# Patient Record
Sex: Male | Born: 1990 | Hispanic: No | Marital: Single | Smoking: Former smoker
Health system: Southern US, Community
[De-identification: ages and names within clinical notes are randomized; demographics above are authoritative.]

## PROBLEM LIST (undated history)

## (undated) DIAGNOSIS — A630 Anogenital (venereal) warts: Secondary | ICD-10-CM

## (undated) DIAGNOSIS — K649 Unspecified hemorrhoids: Secondary | ICD-10-CM

## (undated) DIAGNOSIS — R197 Diarrhea, unspecified: Secondary | ICD-10-CM

## (undated) DIAGNOSIS — B20 Human immunodeficiency virus [HIV] disease: Secondary | ICD-10-CM

## (undated) DIAGNOSIS — A539 Syphilis, unspecified: Secondary | ICD-10-CM

## (undated) HISTORY — PX: COLON SURGERY: SHX602

---

## 2009-03-24 DIAGNOSIS — B20 Human immunodeficiency virus [HIV] disease: Secondary | ICD-10-CM

## 2009-03-24 HISTORY — DX: Human immunodeficiency virus (HIV) disease: B20

## 2012-03-24 DIAGNOSIS — A539 Syphilis, unspecified: Secondary | ICD-10-CM

## 2012-03-24 HISTORY — DX: Syphilis, unspecified: A53.9

## 2013-01-15 ENCOUNTER — Emergency Department (HOSPITAL_COMMUNITY): Payer: Medicaid Other

## 2013-01-15 ENCOUNTER — Emergency Department (HOSPITAL_COMMUNITY)
Admission: EM | Admit: 2013-01-15 | Discharge: 2013-01-15 | Disposition: A | Discharge status: Home / Self Care | Payer: Medicaid Other | Attending: Emergency Medicine | Admitting: Emergency Medicine

## 2013-01-15 ENCOUNTER — Encounter (HOSPITAL_COMMUNITY): Payer: Self-pay | Admitting: Emergency Medicine

## 2013-01-15 DIAGNOSIS — Z860100 Personal history of colon polyps, unspecified: Secondary | ICD-10-CM | POA: Insufficient documentation

## 2013-01-15 DIAGNOSIS — K5900 Constipation, unspecified: Secondary | ICD-10-CM | POA: Insufficient documentation

## 2013-01-15 DIAGNOSIS — N390 Urinary tract infection, site not specified: Secondary | ICD-10-CM | POA: Insufficient documentation

## 2013-01-15 DIAGNOSIS — K648 Other hemorrhoids: Secondary | ICD-10-CM | POA: Insufficient documentation

## 2013-01-15 DIAGNOSIS — Z792 Long term (current) use of antibiotics: Secondary | ICD-10-CM | POA: Insufficient documentation

## 2013-01-15 DIAGNOSIS — K649 Unspecified hemorrhoids: Secondary | ICD-10-CM

## 2013-01-15 DIAGNOSIS — K644 Residual hemorrhoidal skin tags: Secondary | ICD-10-CM | POA: Insufficient documentation

## 2013-01-15 DIAGNOSIS — IMO0002 Reserved for concepts with insufficient information to code with codable children: Secondary | ICD-10-CM | POA: Insufficient documentation

## 2013-01-15 DIAGNOSIS — B20 Human immunodeficiency virus [HIV] disease: Secondary | ICD-10-CM

## 2013-01-15 DIAGNOSIS — Z21 Asymptomatic human immunodeficiency virus [HIV] infection status: Secondary | ICD-10-CM | POA: Insufficient documentation

## 2013-01-15 DIAGNOSIS — Z79899 Other long term (current) drug therapy: Secondary | ICD-10-CM | POA: Insufficient documentation

## 2013-01-15 HISTORY — DX: Human immunodeficiency virus (HIV) disease: B20

## 2013-01-15 LAB — COMPREHENSIVE METABOLIC PANEL
ALT: 12 U/L (ref 0–53)
AST: 25 U/L (ref 0–37)
BUN: 9 mg/dL (ref 6–23)
CO2: 25 mEq/L (ref 19–32)
Calcium: 8.9 mg/dL (ref 8.4–10.5)
Chloride: 100 mEq/L (ref 96–112)
Creatinine, Ser: 0.81 mg/dL (ref 0.50–1.35)
GFR calc Af Amer: >90 mL/min (ref >90)
GFR calc non Af Amer: >90 mL/min (ref >90)
Glucose, Bld: 102 mg/dL — ABNORMAL HIGH (ref 70–99)
Sodium: 134 mEq/L — ABNORMAL LOW (ref 135–145)
Total Bilirubin: 0.4 mg/dL (ref 0.3–1.2)
Total Protein: 10.2 g/dL — ABNORMAL HIGH (ref 6.0–8.3)

## 2013-01-15 LAB — URINALYSIS, ROUTINE W REFLEX MICROSCOPIC
Bilirubin Urine: NEGATIVE
Urobilinogen, UA: 0.2 mg/dL (ref 0.0–1.0)
pH: 6 (ref 5.0–8.0)

## 2013-01-15 LAB — CBC WITH DIFFERENTIAL/PLATELET
Basophils Absolute: 0 10*3/uL (ref 0.0–0.1)
Eosinophils Absolute: 0.2 10*3/uL (ref 0.0–0.7)
Eosinophils Relative: 3 % (ref 0–5)
HCT: 37.6 % — ABNORMAL LOW (ref 39.0–52.0)
Lymphocytes Relative: 30 % (ref 12–46)
Lymphs Abs: 2.9 10*3/uL (ref 0.7–4.0)
MCV: 93.3 fL (ref 78.0–100.0)
Monocytes Absolute: 1.1 10*3/uL — ABNORMAL HIGH (ref 0.1–1.0)
RBC: 4.03 MIL/uL — ABNORMAL LOW (ref 4.22–5.81)
RDW: 13.6 % (ref 11.5–15.5)
WBC: 9.7 10*3/uL (ref 4.0–10.5)

## 2013-01-15 LAB — URINE MICROSCOPIC-ADD ON

## 2013-01-15 MED ORDER — DEXTROSE 5 % IV SOLN
1.0000 g | Freq: Once | INTRAVENOUS | Status: AC
  Administered 2013-01-15: 1 g via INTRAVENOUS
  Filled 2013-01-15: qty 10

## 2013-01-15 MED ORDER — HYDROCORTISONE 1 % EX CREA: TOPICAL_CREAM | CUTANEOUS | Status: DC

## 2013-01-15 MED ORDER — CEPHALEXIN 500 MG PO CAPS: 500.0000 mg | ORAL_CAPSULE | Freq: Four times a day (QID) | ORAL | Status: DC

## 2013-01-15 MED ORDER — LORAZEPAM 2 MG/ML IJ SOLN
1.0000 mg | Freq: Once | INTRAMUSCULAR | Status: AC
  Administered 2013-01-15: 1 mg via INTRAVENOUS
  Filled 2013-01-15: qty 1

## 2013-01-15 MED ORDER — SENNOSIDES-DOCUSATE SODIUM 8.6-50 MG PO TABS: 2.0000 | ORAL_TABLET | Freq: Every day | ORAL | Status: DC

## 2013-01-15 MED ORDER — MORPHINE SULFATE 4 MG/ML IJ SOLN
4.0000 mg | Freq: Once | INTRAMUSCULAR | Status: AC
  Administered 2013-01-15: 4 mg via INTRAVENOUS
  Filled 2013-01-15: qty 1

## 2013-01-15 MED ORDER — ONDANSETRON HCL 4 MG/2ML IJ SOLN
4.0000 mg | Freq: Once | INTRAMUSCULAR | Status: AC
  Administered 2013-01-15: 4 mg via INTRAVENOUS
  Filled 2013-01-15: qty 2

## 2013-01-15 MED ORDER — HYDROCODONE-ACETAMINOPHEN 5-325 MG PO TABS: ORAL_TABLET | ORAL | Status: DC

## 2013-01-15 MED ORDER — KETOROLAC TROMETHAMINE 15 MG/ML IJ SOLN
15.0000 mg | Freq: Once | INTRAMUSCULAR | Status: AC
  Administered 2013-01-15: 15 mg via INTRAVENOUS
  Filled 2013-01-15: qty 1

## 2013-01-15 MED ORDER — IOHEXOL 300 MG/ML  SOLN
100.0000 mL | Freq: Once | INTRAMUSCULAR | Status: AC | PRN
  Administered 2013-01-15: 100 mL via INTRAVENOUS

## 2013-01-15 MED ORDER — IOHEXOL 300 MG/ML  SOLN: 50.0000 mL | Freq: Once | INTRAMUSCULAR | Status: DC | PRN

## 2013-01-15 NOTE — ED Provider Notes (Signed)
CSN: [id number]     Arrival date & time 01/15/13  1326 History   First MD Initiated Contact with Patient 01/15/13 1342     Chief Complaint  Patient presents with  . Back Pain   (Consider location/radiation/quality/duration/timing/severity/associated sxs/prior Treatment) HPI  Jay Simon is a 22 y.o. male recently moved to the area, recently diagnosed with HIV (has not started antiretroviral medications yet) he is followed by [facility], has yet to establish care with infectious disease. Since the ED today complaining of worsening right lumbar pain associated with bright red blood per rectal, rectal and abdominal pain with defecation, worsening over the course of the week. Patient reports fever (Tmax last night at 100.4). Patient denies history of IV drug use, cancer, headache, cervicalgia, chest pain, cough, shortness of breath, dysuria, hematuria, melena. Patient has history of colon resection for polyp removal. Family history significant for colon cancer.   Past Medical History  Diagnosis Date  . HIV disease    History reviewed. No pertinent past surgical history. History reviewed. No pertinent family history. History  Substance Use Topics  . Smoking status: Never Smoker   . Smokeless tobacco: Not on file  . Alcohol Use: No    Review of Systems 10 systems reviewed and found to be negative, except as noted in the HPI  Allergies  Risperdal and Shellfish allergy  Home Medications   Current Outpatient Rx  Name  Route  Sig  Dispense  Refill  . acetaminophen (TYLENOL) 500 MG tablet   Oral   Take 500 mg by mouth every 6 (six) hours as needed for pain.         . cephALEXin (KEFLEX) 500 MG capsule   Oral   Take 1 capsule (500 mg total) by mouth 4 (four) times daily.   20 capsule   0   . HYDROcodone-acetaminophen (NORCO/VICODIN) 5-325 MG per tablet      Take 1-2 tablets by mouth every 6 hours as needed for pain.   15 tablet   0   .  hydrocortisone cream 1 %      Apply to affected area 2 times daily   30 g   0   . senna-docusate (SENOKOT-S) 8.6-50 MG per tablet   Oral   Take 2 tablets by mouth daily.   30 tablet   0    BP 108/66  Pulse 91  Temp(Src) 98.1 F (36.7 C) (Oral)  Resp 26  Ht 5\' 8"  (1.727 m)  Wt 142 lb 5 oz (64.553 kg)  BMI 21.64 kg/m2  SpO2 99% Physical Exam  Nursing note and vitals reviewed. Constitutional: He is oriented to person, place, and time. He appears well-developed and well-nourished. No distress.  HENT:  Head: Normocephalic and atraumatic.  Mouth/Throat: Oropharynx is clear and moist.  Eyes: Conjunctivae and EOM are normal. Pupils are equal, round, and reactive to light.  Neck: Normal range of motion. Neck supple.  Cardiovascular: Normal rate, regular rhythm and intact distal pulses.   Pulmonary/Chest: Effort normal and breath sounds normal. No stridor. No respiratory distress. He has no wheezes. He has no rales. He exhibits no tenderness.  Abdominal: Soft. Bowel sounds are normal. He exhibits no distension and no mass. There is no tenderness. There is no rebound and no guarding.  Genitourinary:  No CVA TTP Bilaterally  GU exam chaperoned by PA-S Sherrye Payor:  No rashes or lesions, +external and internal hemorrhoids, normal rectal tone. Normal stool color. No gross blood.  Musculoskeletal: Normal  range of motion. He exhibits no edema.  No point tenderness to percussion of lumbar spinal processes.  No TTP or paraspinal spasm. Strength is 5 out of 5 to bilateral lower extremities at hip and knee;extensor hallucis longus 5 out of 5. Ankle strength 5 out of 5, no clonus, neurovascularly intact.  No saddle anaesthesia.      Neurological: He is alert and oriented to person, place, and time.  Skin:  No injection marks to bilateral AC areas  Psychiatric: He has a normal mood and affect.    ED Course  Procedures (including critical care time) Labs Review Labs Reviewed   URINALYSIS, ROUTINE W REFLEX MICROSCOPIC - Abnormal; Notable for the following:    APPearance TURBID (*)    Hgb urine dipstick MODERATE (*)    Protein, ur 30 (*)    Nitrite POSITIVE (*)    Leukocytes, UA LARGE (*)    All other components within normal limits  CBC WITH DIFFERENTIAL - Abnormal; Notable for the following:    RBC 4.03 (*)    Hemoglobin 12.5 (*)    HCT 37.6 (*)    Monocytes Absolute 1.1 (*)    All other components within normal limits  COMPREHENSIVE METABOLIC PANEL - Abnormal; Notable for the following:    Sodium 134 (*)    Glucose, Bld 102 (*)    Total Protein 10.2 (*)    Albumin 3.1 (*)    All other components within normal limits  URINE MICROSCOPIC-ADD ON - Abnormal; Notable for the following:    Bacteria, UA FEW (*)    All other components within normal limits  URINE CULTURE  LIPASE, BLOOD  CD4/CD8 (T-HELPER/T-SUPPRESSOR CELL)   Imaging Review Ct Abdomen Pelvis W Contrast  01/15/2013   CLINICAL DATA:  Lower back pain, abdominal pain, rectal bleeding  EXAM: CT ABDOMEN AND PELVIS WITH CONTRAST  TECHNIQUE: Multidetector CT imaging of the abdomen and pelvis was performed using the standard protocol following bolus administration of intravenous contrast.  CONTRAST:  OMNIPAQUE IOHEXOL 300 MG/ML  SOLN  COMPARISON:  None.  FINDINGS: Lung bases are unremarkable. Sagittal and coronal images of the lumbar spine are unremarkable. No destructive bony lesions are noted. Additional reconstructed axial images of the lumbar spine are unremarkable.  Liver, spleen, pancreas and adrenals are unremarkable. No calcified gallstones are noted within gallbladder. Abdominal aorta is unremarkable. Kidneys are symmetrical in size and enhancement. No hydronephrosis or hydroureter.  Abundant stool are noted in right colon and transverse colon. No pericecal inflammation. The appendix is not clearly identified. The terminal ileum is unremarkable. No small bowel obstruction. No colonic  obstruction. No ascites or free air. No adenopathy.  Nonspecific minimal thickening and enhancement of urinary bladder wall. Urinary bladder inflammation or mild cystitis cannot be excluded. Clinical correlation is necessary. No distal colonic obstruction. Prostate gland and seminal vesicles are unremarkable. Bilateral small nonspecific inguinal lymph nodes are noted.  IMPRESSION: 1. No hydronephrosis or hydroureter. 2. Abundant colonic stool in right colon and transverse colon. No pericecal inflammation. The appendix is not clearly identified. 3. Nonspecific minimal thickening of the wall and enhancement of the wall of urinary bladder. Mild inflammation or cystitis cannot be excluded. Clinical correlation is necessary. 4. No destructive bony lesions are noted. No acute fractures.   Electronically Signed   By: Natasha Mead M.D.   On: 01/15/2013 18:18    EKG Interpretation   None       MDM   1. UTI (lower urinary tract infection)  2. Hemorrhoids   3. Constipated   4. HIV (human immunodeficiency virus infection)      Filed Vitals:   01/15/13 1830 01/15/13 1900 01/15/13 1930 01/15/13 2002  BP: 105/67 100/59 108/66 110/75  Pulse: 100 97 91 93  Temp:    99.5 F (37.5 C)  TempSrc:    Oral  Resp:    18  Height:      Weight:      SpO2: 100% 99% 99% 99%     Jay Simon is a 22 y.o. male with newly diagnosed HIV complaining of blood streaked stool, right lumbar pain and fever of 100.4. Urinalysis consistent with infection, patient is given a gram of Rocephin IV. He has no CVA tenderness. I doubt that this is a pyelonephritis. Blood work is otherwise unremarkable. I have ordered a CD4 count 4 expedited care at [facility]. CT abdomen pelvis shows a large stool burden and inflammation of the bladder. There is a delay in reading the lumbar spine CT.   The case signed out to PA  Dammen at shift change. Plan is to followup lumbar CT and d/c if negative.   Medications   morphine 4 MG/ML injection 4 mg (4 mg Intravenous Given 01/15/13 1440)  ondansetron (ZOFRAN) injection 4 mg (4 mg Intravenous Given 01/15/13 1440)  LORazepam (ATIVAN) injection 1 mg (1 mg Intravenous Given 01/15/13 1517)  ketorolac (TORADOL) 15 MG/ML injection 15 mg (15 mg Intravenous Given 01/15/13 1517)  iohexol (OMNIPAQUE) 300 MG/ML solution 100 mL (100 mLs Intravenous Contrast Given 01/15/13 1737)  cefTRIAXone (ROCEPHIN) 1 g in dextrose 5 % 50 mL IVPB (0 g Intravenous Stopped 01/15/13 1954)    Pt is hemodynamically stable, appropriate for, and amenable to discharge at this time. Pt verbalized understanding and agrees with care plan. All questions answered. Outpatient follow-up and specific return precautions discussed.    New Prescriptions   CEPHALEXIN (KEFLEX) 500 MG CAPSULE    Take 1 capsule (500 mg total) by mouth 4 (four) times daily.   HYDROCODONE-ACETAMINOPHEN (NORCO/VICODIN) 5-325 MG PER TABLET    Take 1-2 tablets by mouth every 6 hours as needed for pain.   HYDROCORTISONE CREAM 1 %    Apply to affected area 2 times daily   SENNA-DOCUSATE (SENOKOT-S) 8.6-50 MG PER TABLET    Take 2 tablets by mouth daily.    Note: Portions of this report may have been transcribed using voice recognition software. Every effort was made to ensure accuracy; however, inadvertent computerized transcription errors may be present      Wynetta Emery, PA-C 01/16/13 [phone]

## 2013-01-15 NOTE — ED Provider Notes (Signed)
Medical screening examination/treatment/procedure(s) were performed by non-physician practitioner and as supervising physician I was immediately available for consultation/collaboration.      Charles B. Bernette Mayers, MD 01/15/13 2137

## 2013-01-15 NOTE — ED Notes (Addendum)
Pt reports over past 2-3 days he's been having severe lower back pain and noticed some blood in his stool. States the pain is making him nauseated. States he is very anxious about the pain and now he feels like hes having a panic attack. States he was recently diagnosed with HIV

## 2013-01-15 NOTE — ED Provider Notes (Signed)
Jay Simon S 8:00 PM patient discussed in signout. Patient recently diagnosed with HIV. Has signs of UTI also with some mild backache and abdominal discomforts. CT scans pending.  CT scans show large stool burden and signs of cystitis. No signs of pyelonephritis. No kidney stones. No concerning findings along the lumbar spine. At this time patient stable to be discharged and treated for UTI and constipation. He will followup with his doctors and ID doctors.    Angus Seller, PA-C 01/15/13 2023

## 2013-01-16 NOTE — ED Provider Notes (Signed)
Medical screening examination/treatment/procedure(s) were performed by non-physician practitioner and as supervising physician I was immediately available for consultation/collaboration.    Devoria Albe, MD, Armando Gang   Ward Givens, MD 01/16/13 1101

## 2013-01-17 LAB — URINE CULTURE: Colony Count: >=100000

## 2013-01-17 LAB — CD4/CD8 (T-HELPER/T-SUPPRESSOR CELL)
CD4 absolute: 310 /uL — ABNORMAL LOW (ref 500–1900)
CD4%: 13 % — ABNORMAL LOW (ref 30.0–60.0)

## 2013-01-18 ENCOUNTER — Telehealth (HOSPITAL_COMMUNITY): Payer: Self-pay | Admitting: Emergency Medicine

## 2013-01-18 NOTE — ED Notes (Signed)
Post ED Visit - Positive Culture Follow-up  Culture report reviewed by antimicrobial stewardship pharmacist: []  Wes Dulaney, Pharm.D., BCPS []  Celedonio Miyamoto, Pharm.D., BCPS []  Georgina Pillion, Pharm.D., BCPS [x]  [place], [address].D., BCPS, AAHIVP []  Estella Husk, Pharm.D., BCPS, AAHIVP  Positive urine culture Treated with Keflex, organism sensitive to the same and no further patient follow-up is required at this time.  Kylie A Holland 01/18/2013, 11:23 AM

## 2013-02-09 ENCOUNTER — Telehealth: Payer: Self-pay

## 2013-02-09 NOTE — Telephone Encounter (Signed)
Patient contacted regarding new intake appointment. Date and time given. Information given regarding documents needed to qualify for financial eligibility.  Tammy K King, RN  

## 2013-02-20 ENCOUNTER — Encounter (HOSPITAL_COMMUNITY): Payer: Self-pay | Admitting: Emergency Medicine

## 2013-02-20 ENCOUNTER — Inpatient Hospital Stay (HOSPITAL_COMMUNITY)
Admission: EM | Admit: 2013-02-20 | Discharge: 2013-02-25 | DRG: 974 | Disposition: A | Discharge status: Home Health | Payer: Medicaid Other | Attending: Family Medicine | Admitting: Family Medicine

## 2013-02-20 ENCOUNTER — Emergency Department (HOSPITAL_COMMUNITY): Payer: Medicaid Other

## 2013-02-20 DIAGNOSIS — K5900 Constipation, unspecified: Secondary | ICD-10-CM | POA: Diagnosis present

## 2013-02-20 DIAGNOSIS — B20 Human immunodeficiency virus [HIV] disease: Secondary | ICD-10-CM | POA: Diagnosis present

## 2013-02-20 DIAGNOSIS — Z23 Encounter for immunization: Secondary | ICD-10-CM

## 2013-02-20 DIAGNOSIS — D649 Anemia, unspecified: Secondary | ICD-10-CM | POA: Diagnosis present

## 2013-02-20 DIAGNOSIS — R195 Other fecal abnormalities: Secondary | ICD-10-CM

## 2013-02-20 DIAGNOSIS — R7881 Bacteremia: Secondary | ICD-10-CM

## 2013-02-20 DIAGNOSIS — Z21 Asymptomatic human immunodeficiency virus [HIV] infection status: Secondary | ICD-10-CM

## 2013-02-20 DIAGNOSIS — Z87891 Personal history of nicotine dependence: Secondary | ICD-10-CM

## 2013-02-20 DIAGNOSIS — A419 Sepsis, unspecified organism: Secondary | ICD-10-CM | POA: Diagnosis present

## 2013-02-20 DIAGNOSIS — N151 Renal and perinephric abscess: Secondary | ICD-10-CM | POA: Diagnosis present

## 2013-02-20 DIAGNOSIS — Z860100 Personal history of colon polyps, unspecified: Secondary | ICD-10-CM

## 2013-02-20 DIAGNOSIS — A630 Anogenital (venereal) warts: Secondary | ICD-10-CM | POA: Diagnosis present

## 2013-02-20 DIAGNOSIS — N390 Urinary tract infection, site not specified: Secondary | ICD-10-CM

## 2013-02-20 DIAGNOSIS — N10 Acute tubulo-interstitial nephritis: Secondary | ICD-10-CM | POA: Diagnosis present

## 2013-02-20 DIAGNOSIS — I959 Hypotension, unspecified: Secondary | ICD-10-CM

## 2013-02-20 DIAGNOSIS — N12 Tubulo-interstitial nephritis, not specified as acute or chronic: Secondary | ICD-10-CM | POA: Diagnosis present

## 2013-02-20 DIAGNOSIS — A4151 Sepsis due to Escherichia coli [E. coli]: Principal | ICD-10-CM | POA: Diagnosis present

## 2013-02-20 DIAGNOSIS — B977 Papillomavirus as the cause of diseases classified elsewhere: Secondary | ICD-10-CM | POA: Diagnosis present

## 2013-02-20 DIAGNOSIS — Z85038 Personal history of other malignant neoplasm of large intestine: Secondary | ICD-10-CM

## 2013-02-20 DIAGNOSIS — Z87898 Personal history of other specified conditions: Secondary | ICD-10-CM

## 2013-02-20 DIAGNOSIS — E43 Unspecified severe protein-calorie malnutrition: Secondary | ICD-10-CM | POA: Diagnosis present

## 2013-02-20 DIAGNOSIS — E871 Hypo-osmolality and hyponatremia: Secondary | ICD-10-CM | POA: Diagnosis present

## 2013-02-20 DIAGNOSIS — B1710 Acute hepatitis C without hepatic coma: Secondary | ICD-10-CM | POA: Diagnosis present

## 2013-02-20 HISTORY — DX: Syphilis, unspecified: A53.9

## 2013-02-20 HISTORY — DX: Anogenital (venereal) warts: A63.0

## 2013-02-20 LAB — BASIC METABOLIC PANEL
BUN: 10 mg/dL (ref 6–23)
BUN: 9 mg/dL (ref 6–23)
Calcium: 7.2 mg/dL — ABNORMAL LOW (ref 8.4–10.5)
Calcium: 7.2 mg/dL — ABNORMAL LOW (ref 8.4–10.5)
Chloride: 98 mEq/L (ref 96–112)
Creatinine, Ser: 0.89 mg/dL (ref 0.50–1.35)
GFR calc Af Amer: >90 mL/min (ref >90)
GFR calc Af Amer: >90 mL/min (ref >90)
GFR calc non Af Amer: >90 mL/min (ref >90)
GFR calc non Af Amer: >90 mL/min (ref >90)
Glucose, Bld: 107 mg/dL — ABNORMAL HIGH (ref 70–99)
Potassium: 3.3 mEq/L — ABNORMAL LOW (ref 3.5–5.1)
Sodium: 129 mEq/L — ABNORMAL LOW (ref 135–145)
Sodium: 127 mEq/L — ABNORMAL LOW (ref 135–145)

## 2013-02-20 LAB — CBC WITH DIFFERENTIAL/PLATELET
Basophils Absolute: 0 10*3/uL (ref 0.0–0.1)
Basophils Relative: 0 % (ref 0–1)
Eosinophils Relative: 0 % (ref 0–5)
HCT: 30.3 % — ABNORMAL LOW (ref 39.0–52.0)
MCHC: 34.3 g/dL (ref 30.0–36.0)
MCV: 89.1 fL (ref 78.0–100.0)
Monocytes Absolute: 1.5 10*3/uL — ABNORMAL HIGH (ref 0.1–1.0)
Neutrophils Relative %: 67 % (ref 43–77)
RDW: 15.6 % — ABNORMAL HIGH (ref 11.5–15.5)
WBC: 9.5 10*3/uL (ref 4.0–10.5)

## 2013-02-20 LAB — RAPID URINE DRUG SCREEN, HOSP PERFORMED
Amphetamines: NOT DETECTED
Opiates: NOT DETECTED

## 2013-02-20 LAB — LIPASE, BLOOD: Lipase: 12 U/L (ref 11–59)

## 2013-02-20 LAB — URINALYSIS, ROUTINE W REFLEX MICROSCOPIC
Bilirubin Urine: NEGATIVE
Glucose, UA: NEGATIVE mg/dL
Ketones, ur: NEGATIVE mg/dL
pH: 5.5 (ref 5.0–8.0)

## 2013-02-20 LAB — COMPREHENSIVE METABOLIC PANEL
AST: 24 U/L (ref 0–37)
Albumin: 2.4 g/dL — ABNORMAL LOW (ref 3.5–5.2)
Calcium: 8.3 mg/dL — ABNORMAL LOW (ref 8.4–10.5)
Creatinine, Ser: 1.16 mg/dL (ref 0.50–1.35)
GFR calc Af Amer: >90 mL/min (ref >90)
GFR calc non Af Amer: 87 mL/min — ABNORMAL LOW (ref >90)
Glucose, Bld: 101 mg/dL — ABNORMAL HIGH (ref 70–99)
Total Bilirubin: 0.4 mg/dL (ref 0.3–1.2)

## 2013-02-20 LAB — URINE MICROSCOPIC-ADD ON

## 2013-02-20 LAB — LACTIC ACID, PLASMA: Lactic Acid, Venous: 1.4 mmol/L (ref 0.5–2.2)

## 2013-02-20 LAB — OCCULT BLOOD, POC DEVICE: Fecal Occult Bld: POSITIVE — AB

## 2013-02-20 MED ORDER — POLYETHYLENE GLYCOL 3350 17 G PO PACK
17.0000 g | PACK | Freq: Every day | ORAL | Status: DC | PRN
  Filled 2013-02-20: qty 1

## 2013-02-20 MED ORDER — DEXTROSE 5 % IV SOLN
1.0000 g | Freq: Four times a day (QID) | INTRAVENOUS | Status: DC
  Administered 2013-02-20: 1 g via INTRAVENOUS
  Filled 2013-02-20 (×3): qty 1

## 2013-02-20 MED ORDER — SODIUM CHLORIDE 0.9 % IV BOLUS (SEPSIS)
1000.0000 mL | Freq: Once | INTRAVENOUS | Status: AC
  Administered 2013-02-20: 1000 mL via INTRAVENOUS

## 2013-02-20 MED ORDER — PIPERACILLIN-TAZOBACTAM 3.375 G IVPB
3.3750 g | Freq: Three times a day (TID) | INTRAVENOUS | Status: DC
  Administered 2013-02-21 (×3): 3.375 g via INTRAVENOUS
  Filled 2013-02-20 (×9): qty 50

## 2013-02-20 MED ORDER — SODIUM CHLORIDE 0.9 % IV SOLN
INTRAVENOUS | Status: DC
  Administered 2013-02-20: 18:00:00 via INTRAVENOUS

## 2013-02-20 MED ORDER — IBUPROFEN 800 MG PO TABS
800.0000 mg | ORAL_TABLET | Freq: Three times a day (TID) | ORAL | Status: DC | PRN
  Administered 2013-02-20 – 2013-02-23 (×2): 800 mg via ORAL
  Filled 2013-02-20 (×4): qty 1

## 2013-02-20 MED ORDER — METRONIDAZOLE IN NACL 5-0.79 MG/ML-% IV SOLN
500.0000 mg | Freq: Three times a day (TID) | INTRAVENOUS | Status: DC
  Administered 2013-02-21 (×3): 500 mg via INTRAVENOUS
  Filled 2013-02-20 (×5): qty 100

## 2013-02-20 MED ORDER — SODIUM CHLORIDE 0.9 % IJ SOLN
3.0000 mL | Freq: Two times a day (BID) | INTRAMUSCULAR | Status: DC
  Administered 2013-02-20 – 2013-02-25 (×7): 3 mL via INTRAVENOUS

## 2013-02-20 MED ORDER — POTASSIUM CHLORIDE IN NACL 40-0.9 MEQ/L-% IV SOLN
INTRAVENOUS | Status: DC
  Administered 2013-02-20 – 2013-02-21 (×3): via INTRAVENOUS
  Filled 2013-02-20 (×9): qty 1000

## 2013-02-20 MED ORDER — VANCOMYCIN HCL IN DEXTROSE 1-5 GM/200ML-% IV SOLN
1000.0000 mg | Freq: Three times a day (TID) | INTRAVENOUS | Status: DC
  Administered 2013-02-21 (×3): 1000 mg via INTRAVENOUS
  Filled 2013-02-20 (×5): qty 200

## 2013-02-20 MED ORDER — HEPARIN SODIUM (PORCINE) 5000 UNIT/ML IJ SOLN
5000.0000 [IU] | Freq: Three times a day (TID) | INTRAMUSCULAR | Status: DC
  Administered 2013-02-20 – 2013-02-22 (×3): 5000 [IU] via SUBCUTANEOUS
  Filled 2013-02-20 (×18): qty 1

## 2013-02-20 MED ORDER — HYDROCORTISONE SOD SUCCINATE 100 MG IJ SOLR
50.0000 mg | Freq: Four times a day (QID) | INTRAMUSCULAR | Status: DC
  Administered 2013-02-21 (×3): 50 mg via INTRAVENOUS
  Filled 2013-02-20 (×8): qty 1

## 2013-02-20 MED ORDER — DEXTROSE 5 % IV SOLN
2.0000 g | Freq: Once | INTRAVENOUS | Status: AC
  Administered 2013-02-20: 2 g via INTRAVENOUS
  Filled 2013-02-20: qty 2

## 2013-02-20 MED ORDER — TRAMADOL HCL 50 MG PO TABS
50.0000 mg | ORAL_TABLET | Freq: Four times a day (QID) | ORAL | Status: DC | PRN
  Administered 2013-02-20: 50 mg via ORAL
  Filled 2013-02-20: qty 1

## 2013-02-20 MED ORDER — ACETAMINOPHEN 325 MG PO TABS
650.0000 mg | ORAL_TABLET | Freq: Once | ORAL | Status: AC
  Administered 2013-02-20: 650 mg via ORAL
  Filled 2013-02-20: qty 2

## 2013-02-20 MED ORDER — IBUPROFEN 800 MG PO TABS: 800.0000 mg | ORAL_TABLET | Freq: Three times a day (TID) | ORAL | Status: DC

## 2013-02-20 NOTE — H&P (Signed)
Family Medicine Teaching [facility] Admission History and Physical Service Pager: [phone]  Patient name: Jay Simon Medical record number: [id number] Date of birth: 1990-09-09 Age: 22 y.o. Gender: male  Primary Care Provider: No PCP Per Patient Consultants: Infectious diseases Code Status: Full code  Chief Complaint: Abdominal pain, dysuria  Assessment and Plan: Jay Simon is a 22 y.o. male presenting with UTI . PMH is significant for HIV, ?Colon cancer  # Sepsis secondary to Pyelonephritis: patient has abdominal pain similar to previous presentation. Urinalysis suggestive of UTI with patient having episode of fever up to 102.6 while in ED. Patient also very tachycardic, tachypneic and mildly hypotensive to systolic 90s. He meets SIRS criteria with a source of infection  Admit to telemetry, attending physician Dr. Kandis Mannan antibiotics to include pseudomonas coverage: Cefotaxime 1g q6 hours  Monitor vitals closely  # HIV infection: diagnosed in summer 2014. Last CD4 count was 310 on 01/15/2013. Has a history of syphilis that was treated  Infectious diseases consulted. follow-up recommendations (to see patient tomorrow)  # Hyponatremia: Current sodium is 124. Unknown cause. Patient appears euvolemic. Patient has no mental status changes. May be erroneous result vs SIADH.  Replete slowly with normal saline @50ml /hr  Recheck Bmet now as result may be erroneous  Recheck Bmet q12 hours unless comes back normal  # Hx of ?colon cancer: per patient, had multiple polyps removed in 2012 and was told they were cancerous. Was supposed to follow-up last year but did not.  Refer to GI outpatient for follow-up colonoscopy  Consider obtaining records  # Normocytic anemia: has history of colon cancer and recent blood per rectum found on stool. FOBT was positive. Hemoglobin currently 10.4, down from 12.5  Repeat CBC and trend hemoglobin   FEN/GI: advance diet as  tolerate Prophylaxis: Heparin  Disposition: Admit to telemetry.   History of Present Illness: Jay Simon is a 22 y.o. male presenting with abdominal pain and dysuria. About a month ago, Jay Simon had similar symptoms and presented to the ED where he received Keflex to treat a UTI. After treatment, his symptoms subsided until a few weeks ago where he started having the same back/abdominal pain and dysuria. These symptoms progressively increased until today when he came in to the ED. He has taken Tylenol and Bayer Back and Body for the pain, which has helped. Urinary symptoms include dysuria, frequency and urgency. He has an associated fever to 102 and chills. He has had some nausea with non-bloody, non-bilious vomiting twice yesterday. He has only been able to tolerate water and ice by mouth. He has been constipated since last week and noticed some blood in his stool yesterday after having a bowel movement. He has a dry cough with no sputum production, no chest pain, no shortness of breath. He has a history of polyps removal in 2012 at [facility] in 2012 which showed cancerous cells. He was supposed to follow-up last year, but did not. He was diagnosed with HIV this summer but has not been treated. He has a history of syphilis which was treated with penicillin. He has an appointment with infectious diseases in December to see Jay Simon.  In the ED, he was given a total of 3L of NS boluses for low blood pressures and one dose of ceftriaxone.   Review Of Systems: Per HPI with the following additions:  Otherwise 12 point review of systems was performed and was unremarkable.  There are no active problems to display for  this patient.  Past Medical History: Past Medical History  Diagnosis Date  . HIV disease    Past Surgical History: Past Surgical History  Procedure Laterality Date  . Colon surgery     Social History: History  Substance Use Topics  . Smoking status: Former  Smoker    Types: Cigarettes  . Smokeless tobacco: Not on file  . Alcohol Use: Yes     Comment: occasionally   Additional social history:   Please also refer to relevant sections of EMR.  Family History: No family history on file.  Cancer - aunt, Mgrandmother (breast). Cousin skin cancer. DM - mom, MGM, MGF,  Allergies and Medications: Allergies  Allergen Reactions  . Risperdal [Risperidone] Anaphylaxis  . Shellfish Allergy Anaphylaxis   No current facility-administered medications on file prior to encounter.   No current outpatient prescriptions on file prior to encounter.    Objective: BP 108/70  Pulse 113  Temp(Src) 99.7 F (37.6 C) (Oral)  Resp 26  Wt 142 lb (64.411 kg)  SpO2 100%  Exam: General: Laying in bed in no acute distress HEENT: Oral cavity dry Cardiovascular: Tachycardic, normal rhythm, no murmur Respiratory: clear to auscultation bilaterally, no wheeze Abdomen: Soft, mild tenderness in lower quadrants, no rebound, no guarding Extremities: No edema Skin: No cyanosis, no rashes noted Neuro: Alert and oriented x3  Labs and Imaging: CBC BMET   Recent Labs Lab 02/20/13 0940  WBC 9.5  HGB 10.4*  HCT 30.3*  PLT 177    Recent Labs Lab 02/20/13 0940  NA 124*  K 3.4*  CL 89*  CO2 24  BUN 13  CREATININE 1.16  GLUCOSE 101*  CALCIUM 8.3*     Lymphocyte Subsets  01/15/2013 14:28  CD4 absolute 310 (L)  Total lymphocyte count 2490  CD4% 13 (L)  CD8tox 64 (H)  CD8 T Cell Abs 1590 (H)  Ratio 0.20 (L)   Dg Chest 2 View  02/20/2013   CLINICAL DATA:  Chest pain with fever, nausea/vomiting  EXAM: CHEST  2 VIEW  COMPARISON:  None.  FINDINGS: Lungs are clear. No pleural effusion or pneumothorax.  The heart is normal in size.  Visualized osseous structures are within normal limits.  IMPRESSION: Normal chest radiographs.   Electronically Signed   By: Charline Bills M.D.   On: 02/20/2013 10:26    Jacquelin Hawking, MD 02/20/2013, 1:22 PM PGY-1, [facility] FPTS Intern pager: [phone], text pages welcome  R2 Addendum:  I have seen the above patient and have discussed the patient's presentation, history, objective data, physical exam and assessment and plan with Dr. Mal Misty.  Briefly, this patient is a 22 y.o. year old male presents for abdominal pain and dysuria.  Found to have a urinary tract infection and has been started on and has been transitioned Cefotaxime given his HIV status.  ID will see in the morning and have ordered appropriate labs.  Patient does have hyponatremia and tachycardia.  He is status post 3 L of normal saline in the emergency department.  Persistent hyponatremia, repleating fluids with additional of NS.  Lipase is normal.  Given persistent tachycardia concerns for worsening sepsis picture so plan to repeat lactic acid if tachycardia is not improved when afebrile or if any evidence of blood pressure instability.  Most recent CD4 count greater than 300, no indication for PCP or MAC prophylaxis.  Cultures pending, continue to follow closely.  Further workup of anemia and heme-positive stool as outpatient, unless clinical  worsening.    Andrena Mews, DO [facility] Resident - PGY-3 02/20/2013 8:37 PM

## 2013-02-20 NOTE — Progress Notes (Signed)
ANTIBIOTIC CONSULT NOTE - INITIAL  Pharmacy Consult for Vanco/Zosyn Indication: UTI, sepsis, fever  Allergies  Allergen Reactions  . Risperdal [Risperidone] Anaphylaxis  . Shellfish Allergy Anaphylaxis    Patient Measurements: Height: 5\' 9"  (175.3 cm) Weight: 134 lb (60.782 kg) IBW/kg (Calculated) : 70.7 Adjusted Body Weight:   Vital Signs: Temp: 104 F (40 C) (11/30 2139) Temp src: Oral (11/30 2139) BP: 91/51 mmHg (11/30 2133) Pulse Rate: 134 (11/30 2133) Intake/Output from previous day:   Intake/Output from this shift:    Labs:  Recent Labs  02/20/13 0940 02/20/13 1626 02/20/13 1905  WBC 9.5  --   --   HGB 10.4*  --   --   PLT 177  --   --   CREATININE 1.16 0.89 0.86   Estimated Creatinine Clearance: 113.9 ml/min (by C-G formula based on Cr of 0.86). No results found for this basename: VANCOTROUGH, VANCOPEAK, VANCORANDOM, GENTTROUGH, GENTPEAK, GENTRANDOM, TOBRATROUGH, TOBRAPEAK, TOBRARND, AMIKACINPEAK, AMIKACINTROU, AMIKACIN,  in the last 72 hours   Microbiology: No results found for this or any previous visit (from the past 720 hour(s)).  Medical History: Past Medical History  Diagnosis Date  . HIV disease     Medications:  Prescriptions prior to admission  Medication Sig Dispense Refill  . OVER THE COUNTER MEDICATION Take 2 tablets by mouth daily as needed (pain). Bayer Back & Body       Assessment:  22 y/o M with h/o newly dx HIV presents with fever, chills, sweats, flank pain, N/V, dysuria. UA suggests UTI. Symptoms in ER include temp up to 102.6, tachycardia, tachypnea, and hypotension. FOB +.  Temp 104. WBC 9.5. Na 127, Scr 0.86 (CrCl >100)  Goal of Therapy:  Vancomycin trough level 15-20 mcg/ml  Plan:  D/c Rocephin, Cefotaxime Zosyn 3.375g Iv q8hr Vancomycin 1g IV q8hr. Trough after 3-5 doses.   Jay Simon S. Merilynn Finland, PharmD, BCPS Clinical Staff Pharmacist Pager [phone]  Misty Stanley Stillinger 02/20/2013,10:11 PM

## 2013-02-20 NOTE — ED Notes (Signed)
Pt arrived with significant other with c/o fevers, chills and sweats. Flank pain that radiates around right side to abdomen and weakness since 02/15/13. Pt stated that he has be nauseated and vomited x2 in the past 24hrs. Stated that he was constipated and took laxatives with relief. Tried Theraflu and Bayer back and body at home with some relief of symptoms but symptoms return.

## 2013-02-20 NOTE — Progress Notes (Signed)
S: feeling very anxious, tolerating small sips of liquid, attesting to continued abdominal discomfort O: BP 91/51  Pulse 134  Temp(Src) 104 F (40 C) (Oral)  Resp 18  Ht 5\' 9"  (1.753 m)  Wt 134 lb (60.782 kg)  BMI 19.78 kg/m2  SpO2 99% Gen: NAD, lying in bed, asking questions, diaphoretic  Abdomen: positive murphy's (questionable palpable gallbladder) and suprapubic tenderness, voluntary guarding, no rebound A/P: 22 y.o M with hx of untreated HIV presenting with n/v found to have pyelo now meeting septic criteria, previously on cefotax, possible superimposed cholecystitis vs enterohemorrhagic gastritis vs perf  -transition to vanc/zosyn for broader coverage given fever curve -adding flagyl for anerobic coverage given abdominal exam -stress dose steroids following cortisol levels -CT ab/pelvis -ibuprofen for fever prn -CCM aware, low threshold for transfer -close vs monitoring  Charlane Ferretti, MD Family Medicine PGY-1 Please page or call with questions

## 2013-02-20 NOTE — ED Provider Notes (Signed)
CSN: [id number]     Arrival date & time 02/20/13  0901 History   First MD Initiated Contact with Patient 02/20/13 [phone]     Chief Complaint  Patient presents with  . Fever  . Night Sweats  . Flank Pain    HPI  Jay Simon is a 22 y.o. male with a PMH of HIV who presents to the ED for evaluation of fever, night sweats, and flank pain.  History was provided by the patient.  Patient states that he has not been feeling well for the past 6 days. He states that he has had constant cramping/aching right lower quadrant, flank, and lower back pain the past 6 days. Movement makes his pain worse.  She had similar pain about a month ago and was seen in the emergency department on 01/15/13. He was diagnosed with a urinary tract infection and constipation. He was discharged with Keflex antibiotics which he took the full prescription. He states that he felt better while taking antibiotics but his symptoms returned after he finished the antibiotics. He states he was taking a stool softener that was prescribed which helped his symptoms as well. He has been taking Aleve for his abdominal pain with temporary relief in his pain.  He also states that he has had some dysuria with no hematuria, penile pain/discharge, or testicular pain.  He states that he started vomiting yesterday. He has had multiple episodes of emesis and is unable to keep anything down. No hematemesis. He states that he was constipated a few days ago he took a laxative and is now having regular bowel movements again. No rectal pain or rectal bleeding. Patient denies any trauma or injuries. He also has had a non-productive cough for the past few days.  No chest pain or SOB.  He also has had fevers of 102 a few days ago.  Patient was diagnosed with HIV this summer. He has not yet started antiviral medications and has a scheduled appointment with an infectious disease specialist coming up. She denies any previous abdominal surgeries.  Colon surgery in the  past was for polyp removal and was done rectally.  He denies any history of kidney stones. No sick contacts. No recent travel.   Past Medical History  Diagnosis Date  . HIV disease    Past Surgical History  Procedure Laterality Date  . Colon surgery     No family history on file. History  Substance Use Topics  . Smoking status: Former Smoker    Types: Cigarettes  . Smokeless tobacco: Not on file  . Alcohol Use: Yes     Comment: occasionally    Review of Systems  Constitutional: Positive for fever, chills, diaphoresis, activity change, appetite change and fatigue.  HENT: Negative for congestion, rhinorrhea and sore throat.   Eyes: Negative for photophobia and visual disturbance.  Respiratory: Positive for cough. Negative for choking, shortness of breath and wheezing.   Cardiovascular: Negative for chest pain, palpitations and leg swelling.  Gastrointestinal: Positive for nausea, vomiting, abdominal pain and constipation. Negative for diarrhea, blood in stool, abdominal distention, anal bleeding and rectal pain.  Genitourinary: Positive for dysuria and flank pain. Negative for hematuria, decreased urine volume, penile swelling, scrotal swelling, difficulty urinating, genital sores, penile pain and testicular pain.  Musculoskeletal: Positive for back pain. Negative for gait problem, joint swelling, myalgias and neck pain.  Skin: Negative for color change.  Neurological: Positive for weakness (generalized). Negative for dizziness, light-headedness, numbness and headaches.  Psychiatric/Behavioral: Negative for  confusion.    Allergies  Risperdal and Shellfish allergy  Home Medications   Current Outpatient Rx  Name  Route  Sig  Dispense  Refill  . OVER THE COUNTER MEDICATION   Oral   Take 2 tablets by mouth daily as needed (pain). [facility]          BP 109/64  Pulse 130  Temp(Src) 101 F (38.3 C) (Oral)  Resp 18  Wt 142 lb (64.411 kg)  SpO2 100%  Filed  Vitals:   02/20/13 1030 02/20/13 1045 02/20/13 1100 02/20/13 1142  BP: 102/61 103/62 102/67 94/50  Pulse: 121 123 126 113  Temp:    99.7 F (37.6 C)  TempSrc:    Oral  Resp: 18 23  18   Weight:      SpO2: 100% 100% 100% 100%    Physical Exam  Constitutional: He is oriented to person, place, and time. He appears well-developed and well-nourished. No distress.  Ill appearing  HENT:  Head: Normocephalic and atraumatic.  Right Ear: External ear normal.  Left Ear: External ear normal.  Nose: Nose normal.  Mouth/Throat: Oropharynx is clear and moist. No oropharyngeal exudate.  Eyes: Conjunctivae are normal. Pupils are equal, round, and reactive to light. Right eye exhibits no discharge. Left eye exhibits no discharge.  Neck: Normal range of motion. Neck supple.  Cardiovascular: Normal rate, regular rhythm, normal heart sounds and intact distal pulses.  Exam reveals no gallop and no friction rub.   No murmur heard. Pulmonary/Chest: Effort normal and breath sounds normal. No respiratory distress. He has no wheezes. He has no rales. He exhibits no tenderness.  Abdominal: Soft. Bowel sounds are normal. He exhibits no distension and no mass. There is tenderness. There is no rebound and no guarding.  Tenderness to palpation to the right flank and right lower lumbar paraspinal muscles  Musculoskeletal: Normal range of motion. He exhibits no edema and no tenderness.  Neurological: He is alert and oriented to person, place, and time.  Skin: Skin is warm. He is diaphoretic.    ED Course  Procedures (including critical care time) Labs Review Labs Reviewed  CULTURE, BLOOD (ROUTINE X 2)  CULTURE, BLOOD (ROUTINE X 2)  URINALYSIS, ROUTINE W REFLEX MICROSCOPIC  CBC WITH DIFFERENTIAL  COMPREHENSIVE METABOLIC PANEL  LIPASE, BLOOD  LACTIC ACID, PLASMA   Imaging Review No results found.  EKG Interpretation    Date/Time:  Sunday February 20 2013 09:56:39 EST Ventricular Rate:  131 PR  Interval:  139 QRS Duration: 93 QT Interval:  304 QTC Calculation: 449 R Axis:   84 Text Interpretation:  Sinus tachycardia Borderline T wave abnormalities No previous tracing Confirmed by Anitra Lauth  MD, WHITNEY ([id number]) on 02/20/2013 12:04:44 PM           Results for orders placed during the hospital encounter of 02/20/13  URINALYSIS, ROUTINE W REFLEX MICROSCOPIC      Result Value Range   Color, Urine YELLOW  YELLOW   APPearance CLEAR  CLEAR   Specific Gravity, Urine 1.014  1.005 - 1.030   pH 5.5  5.0 - 8.0   Glucose, UA NEGATIVE  NEGATIVE mg/dL   Hgb urine dipstick SMALL (*) NEGATIVE   Bilirubin Urine NEGATIVE  NEGATIVE   Ketones, ur NEGATIVE  NEGATIVE mg/dL   Protein, ur 30 (*) NEGATIVE mg/dL   Urobilinogen, UA 1.0  0.0 - 1.0 mg/dL   Nitrite POSITIVE (*) NEGATIVE   Leukocytes, UA LARGE (*) NEGATIVE  CBC WITH DIFFERENTIAL  Result Value Range   WBC 9.5  4.0 - 10.5 K/uL   RBC 3.40 (*) 4.22 - 5.81 MIL/uL   Hemoglobin 10.4 (*) 13.0 - 17.0 g/dL   HCT 16.1 (*) 09.6 - 04.5 %   MCV 89.1  78.0 - 100.0 fL   MCH 30.6  26.0 - 34.0 pg   MCHC 34.3  30.0 - 36.0 g/dL   RDW 40.9 (*) 81.1 - 91.4 %   Platelets 177  150 - 400 K/uL   Neutrophils Relative % 67  43 - 77 %   Neutro Abs 6.4  1.7 - 7.7 K/uL   Lymphocytes Relative 17  12 - 46 %   Lymphs Abs 1.6  0.7 - 4.0 K/uL   Monocytes Relative 16 (*) 3 - 12 %   Monocytes Absolute 1.5 (*) 0.1 - 1.0 K/uL   Eosinophils Relative 0  0 - 5 %   Eosinophils Absolute 0.0  0.0 - 0.7 K/uL   Basophils Relative 0  0 - 1 %   Basophils Absolute 0.0  0.0 - 0.1 K/uL  COMPREHENSIVE METABOLIC PANEL      Result Value Range   Sodium 124 (*) 135 - 145 mEq/L   Potassium 3.4 (*) 3.5 - 5.1 mEq/L   Chloride 89 (*) 96 - 112 mEq/L   CO2 24  19 - 32 mEq/L   Glucose, Bld 101 (*) 70 - 99 mg/dL   BUN 13  6 - 23 mg/dL   Creatinine, Ser 7.82  0.50 - 1.35 mg/dL   Calcium 8.3 (*) 8.4 - 10.5 mg/dL   Total Protein 9.7 (*) 6.0 - 8.3 g/dL   Albumin 2.4 (*) 3.5 -  5.2 g/dL   AST 24  0 - 37 U/L   ALT 13  0 - 53 U/L   Alkaline Phosphatase 49  39 - 117 U/L   Total Bilirubin 0.4  0.3 - 1.2 mg/dL   GFR calc non Af Amer 87 (*) >90 mL/min   GFR calc Af Amer >90  >90 mL/min  LIPASE, BLOOD      Result Value Range   Lipase 12  11 - 59 U/L  LACTIC ACID, PLASMA      Result Value Range   Lactic Acid, Venous 1.4  0.5 - 2.2 mmol/L  URINE MICROSCOPIC-ADD ON      Result Value Range   WBC, UA TOO NUMEROUS TO COUNT  <3 WBC/hpf   RBC / HPF 3-6  <3 RBC/hpf   Bacteria, UA MANY (*) RARE  OCCULT BLOOD, POC DEVICE      Result Value Range   Fecal Occult Bld POSITIVE (*) NEGATIVE       DG Chest 2 View (Final result)  Result time: 02/20/13 10:26:43    Final result by Rad Results In Interface (02/20/13 10:26:43)    Narrative:   CLINICAL DATA: Chest pain with fever, nausea/vomiting  EXAM: CHEST 2 VIEW  COMPARISON: None.  FINDINGS: Lungs are clear. No pleural effusion or pneumothorax.  The heart is normal in size.  Visualized osseous structures are within normal limits.  IMPRESSION: Normal chest radiographs.   Electronically Signed By: Charline Bills M.D. On: 02/20/2013 10:26    MDM   Zyrus Hetland is a 22 y.o. male with a PMH of HIV who presents to the ED for evaluation of fever, night sweats, and flank pain.  EKG ordered for tachycardia.  UA, occult blood, lipase, lactic acid, blood cultures, CBC, CMP, and chest x-ray ordered.  1L NS ordered.  Tylenol ordered for fever.    Rechecks  11:13 AM = rectal exam performed at bedside with RN present. Small external hernia at the 6:00 position of the anus. Good sphincter tone. Minimal amount of stool sent for occult testing.  No gross blood on exam. GU exam also performed at bedside. No evidence of inguinal hernias bilaterally. No testicular tenderness bilaterally. Patient still tachycardic in the 120s. Ordering a second liter of fluid. 12:30 PM = NS bolus x 3 ordered.  Urinalysis suggestive of a UTI.   IV Rocephin ordered.  Spoke with patient about admission and he is in agreement.   Consults  1:12 PM = Spoke with Dr. Caleb Popp who would like to come and evaluate the patient before choosing place of admission.  He will put in admit orders.        Etiology of flank, back and abdominal pain likely due to a UTI vs pyelonephritis.  Patient has evidence of a UTI.  Urine sent for culture.  Blood cultures were drawn and he was started on Rocephin.  He met sepsis criteria with tachycardia and fever.  He had mild hypotension throughout his ED visit however remained stable.  Lactic acid WNL.  He was also found to have hyponatremia.  He initially denied any rectal bleeding but later states that a few days ago he did have some mild rectal bleeding.  Hemoccult was positive in the ED with no gross blood.  He has mild anemia with a stable H&H at this time, however it did drop slightly from his previous H&H 1 month ago.  Patient immune compromised with his new HIV dx.  He was admitted for further evaluation and management.  Patient in agreement with discharge and plan.     Final impressions: 1. UTI (urinary tract infection)   2. Sepsis   3. HIV (human immunodeficiency virus infection)   4. Hyponatremia   5. Positive occult stool blood test      Luiz Iron PA-C   This patient was discussed with Dr. Mingo Amber, PA-C 02/22/13 [phone]

## 2013-02-21 ENCOUNTER — Encounter (HOSPITAL_COMMUNITY): Payer: Self-pay | Admitting: Radiology

## 2013-02-21 ENCOUNTER — Inpatient Hospital Stay (HOSPITAL_COMMUNITY): Payer: Medicaid Other

## 2013-02-21 DIAGNOSIS — B20 Human immunodeficiency virus [HIV] disease: Secondary | ICD-10-CM | POA: Diagnosis present

## 2013-02-21 DIAGNOSIS — E871 Hypo-osmolality and hyponatremia: Secondary | ICD-10-CM

## 2013-02-21 DIAGNOSIS — A419 Sepsis, unspecified organism: Secondary | ICD-10-CM | POA: Diagnosis present

## 2013-02-21 DIAGNOSIS — I959 Hypotension, unspecified: Secondary | ICD-10-CM | POA: Diagnosis present

## 2013-02-21 DIAGNOSIS — A630 Anogenital (venereal) warts: Secondary | ICD-10-CM | POA: Diagnosis present

## 2013-02-21 DIAGNOSIS — R197 Diarrhea, unspecified: Secondary | ICD-10-CM

## 2013-02-21 LAB — HEPATITIS B CORE ANTIBODY, TOTAL: Hep B Core Total Ab: NONREACTIVE

## 2013-02-21 LAB — PROCALCITONIN: Procalcitonin: 8.07 ng/mL

## 2013-02-21 LAB — HIV-1 RNA ULTRAQUANT REFLEX TO GENTYP+
HIV 1 RNA Quant: 115000 copies/mL — ABNORMAL HIGH (ref <20)
HIV-1 RNA Quant, Log: 5.06 {Log} — ABNORMAL HIGH (ref <1.30)

## 2013-02-21 LAB — RPR TITER: RPR Titer: 1:2 {titer} — AB

## 2013-02-21 LAB — CBC
HCT: 26.6 % — ABNORMAL LOW (ref 39.0–52.0)
Hemoglobin: 9.1 g/dL — ABNORMAL LOW (ref 13.0–17.0)
MCH: 30.7 pg (ref 26.0–34.0)
MCHC: 34.2 g/dL (ref 30.0–36.0)
MCV: 89.9 fL (ref 78.0–100.0)
RDW: 16.1 % — ABNORMAL HIGH (ref 11.5–15.5)

## 2013-02-21 LAB — CORTISOL: Cortisol, Plasma: 8.5 ug/dL

## 2013-02-21 LAB — T.PALLIDUM AB, IGG: T pallidum Antibodies (TP-PA): >8 S/CO — ABNORMAL HIGH (ref <0.90)

## 2013-02-21 LAB — HEPATITIS B SURFACE ANTIBODY,QUALITATIVE

## 2013-02-21 LAB — COMPREHENSIVE METABOLIC PANEL
Albumin: 1.7 g/dL — ABNORMAL LOW (ref 3.5–5.2)
Alkaline Phosphatase: 47 U/L (ref 39–117)
BUN: 7 mg/dL (ref 6–23)
Calcium: 7.7 mg/dL — ABNORMAL LOW (ref 8.4–10.5)
Creatinine, Ser: 0.61 mg/dL (ref 0.50–1.35)
GFR calc Af Amer: >90 mL/min (ref >90)
Glucose, Bld: 111 mg/dL — ABNORMAL HIGH (ref 70–99)
Potassium: 4.2 mEq/L (ref 3.5–5.1)
Total Protein: 7.6 g/dL (ref 6.0–8.3)

## 2013-02-21 LAB — HEPATITIS B SURFACE ANTIGEN: Hepatitis B Surface Ag: NEGATIVE

## 2013-02-21 LAB — LACTIC ACID, PLASMA: Lactic Acid, Venous: 0.6 mmol/L (ref 0.5–2.2)

## 2013-02-21 LAB — MAGNESIUM: Magnesium: 2.1 mg/dL (ref 1.5–2.5)

## 2013-02-21 LAB — HEPATITIS C ANTIBODY: HCV Ab: REACTIVE — AB

## 2013-02-21 MED ORDER — PNEUMOCOCCAL VAC POLYVALENT 25 MCG/0.5ML IJ INJ
0.5000 mL | INJECTION | INTRAMUSCULAR | Status: DC
  Filled 2013-02-21: qty 0.5

## 2013-02-21 MED ORDER — HEPATITIS B VAC RECOMBINANT 10 MCG/0.5ML IJ SUSP: 0.5000 mL | Freq: Once | INTRAMUSCULAR | Status: DC

## 2013-02-21 MED ORDER — SODIUM CHLORIDE 0.9 % IV SOLN
INTRAVENOUS | Status: DC
  Administered 2013-02-21: 16:00:00 via INTRAVENOUS
  Administered 2013-02-22: 10 mL/h via INTRAVENOUS

## 2013-02-21 MED ORDER — SODIUM CHLORIDE 0.9 % IV BOLUS (SEPSIS)
1000.0000 mL | Freq: Once | INTRAVENOUS | Status: AC
  Administered 2013-02-21: 1000 mL via INTRAVENOUS

## 2013-02-21 MED ORDER — DEXTROSE 5 % IV SOLN
2.0000 g | INTRAVENOUS | Status: DC
  Administered 2013-02-21: 2 g via INTRAVENOUS
  Filled 2013-02-21 (×2): qty 2

## 2013-02-21 MED ORDER — IOHEXOL 300 MG/ML  SOLN
100.0000 mL | Freq: Once | INTRAMUSCULAR | Status: AC | PRN
  Administered 2013-02-21: 100 mL via INTRAVENOUS

## 2013-02-21 MED ORDER — IOHEXOL 300 MG/ML  SOLN
20.0000 mL | INTRAMUSCULAR | Status: AC
  Administered 2013-02-21 (×2): 25 mL via ORAL

## 2013-02-21 MED ORDER — HEPATITIS B VAC RECOMBINANT 10 MCG/0.5ML IJ SUSP
1.0000 mL | Freq: Once | INTRAMUSCULAR | Status: AC
  Administered 2013-02-22: 1 mL via INTRAMUSCULAR
  Filled 2013-02-21: qty 1

## 2013-02-21 MED ORDER — INFLUENZA VAC SPLIT QUAD 0.5 ML IM SUSP
0.5000 mL | INTRAMUSCULAR | Status: DC
  Filled 2013-02-21: qty 0.5

## 2013-02-21 MED ORDER — TUBERCULIN PPD 5 UNIT/0.1ML ID SOLN
5.0000 [IU] | Freq: Once | INTRADERMAL | Status: AC
  Administered 2013-02-21: 5 [IU] via INTRADERMAL
  Filled 2013-02-21: qty 0.1

## 2013-02-21 MED ORDER — HEPATITIS A VACCINE 1440 EL U/ML IM SUSP
1.0000 mL | Freq: Once | INTRAMUSCULAR | Status: AC
  Administered 2013-02-21: 1440 [IU] via INTRAMUSCULAR
  Filled 2013-02-21: qty 1

## 2013-02-21 NOTE — Consult Note (Addendum)
Unassigned patient Reason for Consult: Blood in stool and a drop in hemoglobin; 10.4-9.1 gm/dl Referring Physician: FPTS  Jay Simon is an 22 y.o. male.  HPI: 22 year old black male, admitted with fevers, urinary frequency and diagnosed with pyelonephritis and sepsis and an UTI on admission. Was treated in the ER with Keflex for a UTI recently. He gives a history of constipation for the last month, with intermittent rectal bleeding-BRBPR but denies having any melena. He was not very comfortable sharing his medical history with and was playing with his phone during my interview. He denies having any abdominal pain, nausea or vomiting at this time. Prior to the onset of his constipation, he claims he has regular BM's every day. He tends to strain at times and that is when he has noticed BRBPR. He feels the rectal bleeding has never been "a lot". 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 He has never had a colonoscopy and has never been diagnosed with colon cancer or colonic polyps. He claims he has a good appetite and his weight has been stable. He has been having fevers with chills over the last 7-10 days. 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   Past Medical History  Diagnosis Date  . HIV disease 2011  . Syphilis 2014  . Anal condyloma    Past Surgical History  Procedure Laterality Date  . Perianal warts removed     Family History  Problem Relation Age of Onset  . Diabetes Mother   . Diabetes Maternal Grandmother   . Diabetes Maternal Grandfather   . Cancer Maternal Grandmother     breast   Social History:  reports that he has quit smoking. His smoking use included Cigarettes. He smoked 0.00 packs per day. He does not have any smokeless tobacco history on file. He reports that he drinks alcohol. His drug history is not on file.  Allergies:  Allergies   Allergen Reactions  . Risperdal [Risperidone] Anaphylaxis  . Shellfish Allergy Anaphylaxis   Medications: I have reviewed the patient's current medications.  Results for orders placed during the hospital encounter of 02/20/13 (from the past 48 hour(s))  CBC WITH DIFFERENTIAL     Status: Abnormal   Collection Time    02/20/13  9:40 AM      Result Value Range   WBC 9.5  4.0 - 10.5 K/uL   RBC 3.40 (*) 4.22 - 5.81 MIL/uL   Hemoglobin 10.4 (*) 13.0 - 17.0 g/dL   HCT 47.8 (*) 29.5 - 62.1 %   MCV 89.1  78.0 - 100.0 fL   MCH 30.6  26.0 - 34.0 pg   MCHC 34.3  30.0 - 36.0 g/dL   RDW 30.8 (*) 65.7 - 84.6 %   Platelets 177  150 - 400 K/uL   Neutrophils Relative % 67  43 - 77 %   Neutro Abs 6.4  1.7 - 7.7 K/uL   Lymphocytes Relative 17  12 - 46 %   Lymphs Abs 1.6  0.7 - 4.0 K/uL   Monocytes Relative 16 (*) 3 - 12 %   Monocytes Absolute 1.5 (*) 0.1 - 1.0 K/uL   Eosinophils Relative 0  0 - 5 %   Eosinophils Absolute 0.0  0.0 - 0.7 K/uL   Basophils Relative 0  0 - 1 %   Basophils Absolute 0.0  0.0 -  0.1 K/uL  COMPREHENSIVE METABOLIC PANEL     Status: Abnormal   Collection Time    02/20/13  9:40 AM      Result Value Range   Sodium 124 (*) 135 - 145 mEq/L   Potassium 3.4 (*) 3.5 - 5.1 mEq/L   Chloride 89 (*) 96 - 112 mEq/L   CO2 24  19 - 32 mEq/L   Glucose, Bld 101 (*) 70 - 99 mg/dL   BUN 13  6 - 23 mg/dL   Creatinine, Ser 1.61  0.50 - 1.35 mg/dL   Calcium 8.3 (*) 8.4 - 10.5 mg/dL   Total Protein 9.7 (*) 6.0 - 8.3 g/dL   Albumin 2.4 (*) 3.5 - 5.2 g/dL   AST 24  0 - 37 U/L   ALT 13  0 - 53 U/L   Alkaline Phosphatase 49  39 - 117 U/L   Total Bilirubin 0.4  0.3 - 1.2 mg/dL   GFR calc non Af Amer 87 (*) >90 mL/min   GFR calc Af Amer >90  >90 mL/min   Comment: (NOTE)     The eGFR has been calculated using the CKD EPI equation.     This calculation has not been validated in all clinical situations.     eGFR's persistently <90 mL/min signify possible Chronic Kidney     Disease.   LIPASE, BLOOD     Status: None   Collection Time    02/20/13  9:40 AM      Result Value Range   Lipase 12  11 - 59 U/L  LACTIC ACID, PLASMA     Status: None   Collection Time    02/20/13  9:41 AM      Result Value Range   Lactic Acid, Venous 1.4  0.5 - 2.2 mmol/L  CULTURE, BLOOD (ROUTINE X 2)     Status: None   Collection Time    02/20/13 10:00 AM      Result Value Range   Specimen Description BLOOD LEFT ARM     Special Requests BOTTLES DRAWN AEROBIC AND ANAEROBIC B10CC R5CC     Culture  Setup Time       Value: 02/20/2013 12:54     Performed at [facility]   Culture       Value: GRAM NEGATIVE RODS     Note: Gram Stain Report Called to,Read Back By and Verified With: Jerl Mina RN on 02/21/13 at 00:35 by Christie Nottingham     Performed at [facility]   Report Status PENDING    CULTURE, BLOOD (ROUTINE X 2)     Status: None   Collection Time    02/20/13 10:10 AM      Result Value Range   Specimen Description BLOOD LEFT HAND     Special Requests BOTTLES DRAWN AEROBIC ONLY 10CC     Culture  Setup Time       Value: 02/20/2013 12:54     Performed at [facility]   Culture       Value:        BLOOD CULTURE RECEIVED NO GROWTH TO DATE CULTURE WILL BE HELD FOR 5 DAYS BEFORE ISSUING A FINAL NEGATIVE REPORT     Performed at [facility]   Report Status PENDING    OCCULT BLOOD, POC DEVICE     Status: Abnormal   Collection Time    02/20/13 11:22 AM      Result Value Range   Fecal Occult  Bld POSITIVE (*) NEGATIVE  URINALYSIS, ROUTINE W REFLEX MICROSCOPIC     Status: Abnormal   Collection Time    02/20/13 11:29 AM      Result Value Range   Color, Urine YELLOW  YELLOW   APPearance CLEAR  CLEAR   Specific Gravity, Urine 1.014  1.005 - 1.030   pH 5.5  5.0 - 8.0   Glucose, UA NEGATIVE  NEGATIVE mg/dL   Hgb urine dipstick SMALL (*) NEGATIVE   Bilirubin Urine NEGATIVE  NEGATIVE   Ketones, ur NEGATIVE  NEGATIVE mg/dL   Protein, ur 30 (*) NEGATIVE mg/dL    Urobilinogen, UA 1.0  0.0 - 1.0 mg/dL   Nitrite POSITIVE (*) NEGATIVE   Leukocytes, UA LARGE (*) NEGATIVE  URINE MICROSCOPIC-ADD ON     Status: Abnormal   Collection Time    02/20/13 11:29 AM      Result Value Range   WBC, UA TOO NUMEROUS TO COUNT  <3 WBC/hpf   RBC / HPF 3-6  <3 RBC/hpf   Bacteria, UA MANY (*) RARE  URINE CULTURE     Status: None   Collection Time    02/20/13 11:29 AM      Result Value Range   Specimen Description URINE, RANDOM     Special Requests NONE     Culture  Setup Time       Value: 02/20/2013 17:04     Performed at [facility] Count       Value: >=100,000 COLONIES/ML     Performed at [facility]   Culture       Value: ESCHERICHIA COLI     Performed at [facility]   Report Status PENDING    BASIC METABOLIC PANEL     Status: Abnormal   Collection Time    02/20/13  4:26 PM      Result Value Range   Sodium 129 (*) 135 - 145 mEq/L   Potassium 3.3 (*) 3.5 - 5.1 mEq/L   Chloride 98  96 - 112 mEq/L   Comment: DELTA CHECK NOTED   CO2 21  19 - 32 mEq/L   Glucose, Bld 96  70 - 99 mg/dL   BUN 10  6 - 23 mg/dL   Creatinine, Ser 1.61  0.50 - 1.35 mg/dL   Calcium 7.2 (*) 8.4 - 10.5 mg/dL   GFR calc non Af Amer >90  >90 mL/min   GFR calc Af Amer >90  >90 mL/min   Comment: (NOTE)     The eGFR has been calculated using the CKD EPI equation.     This calculation has not been validated in all clinical situations.     eGFR's persistently <90 mL/min signify possible Chronic Kidney     Disease.  HIV-1 RNA ULTRAQUANT REFLEX TO GENTYP+     Status: Abnormal   Collection Time    02/20/13  4:40 PM      Result Value Range   HIV 1 RNA Quant 115000 (*) <20 copies/mL   HIV1 RNA Quant, Log 5.06 (*) <1.30 log 10   Comment: (NOTE)     HIV Genotype will reflex if the HIV Quant is 1000 copies/mL or higher.     This test utilizes the [place] FDA approved Roche HIV-1 Test Kit by RT-PCR.     Performed at [facility]  RPR     Status:  Abnormal   Collection Time    02/20/13  4:40 PM  Result Value Range   RPR Reactive (*) NON REACTIVE   Comment: CONFIRMATORY TESTING TO FOLLOW     Performed at [facility]  HEPATITIS A ANTIBODY, TOTAL     Status: None   Collection Time    02/20/13  4:40 PM      Result Value Range   Hep A Total Ab NON REACTIVE  NON REACTIVE   Comment: Performed at [facility]  HEPATITIS B SURFACE ANTIBODY     Status: None   Collection Time    02/20/13  4:40 PM      Result Value Range   Hep B S Ab INDETER  NEGATIVE   Comment: (NOTE)     Unable to determine if anti-HBs is present at levels consistent with     immunity.  Patient's immune status should be further assessed by     considering other clinical information.     Result repeated and verified.     Performed at [facility]  HEPATITIS B SURFACE ANTIGEN     Status: None   Collection Time    02/20/13  4:40 PM      Result Value Range   Hepatitis B Surface Ag NEGATIVE  NEGATIVE   Comment: Performed at [facility]  HEPATITIS B CORE ANTIBODY, TOTAL     Status: None   Collection Time    02/20/13  4:40 PM      Result Value Range   Hep B Core Total Ab NON REACTIVE  NON REACTIVE   Comment: Performed at [facility]  HEPATITIS C ANTIBODY     Status: Abnormal   Collection Time    02/20/13  4:40 PM      Result Value Range   HCV Ab Reactive (*) NEGATIVE   Comment: (NOTE)                                                                               This test is for screening purposes only.  Reactive results should be     confirmed by an alternative method.  Suggest HCV Qualitative, PCR,     test code 16109.  Specimens will be stable for reflex testing up to 3     days after collection.     Performed at [facility]  RPR TITER     Status: Abnormal   Collection Time    02/20/13  4:40 PM      Result Value Range   RPR Titer 1:2 (*) NON REACTIVE   Comment: Performed at [facility]   T.PALLIDUM AB, IGG     Status: Abnormal   Collection Time    02/20/13  4:40 PM      Result Value Range   T pallidum Antibodies (TP-PA) >8.00 (*) <0.90 S/CO   Comment: (NOTE)     <0.90 S/CO          NON REACTIVE     0.90-1.00 S/CO      INDETERMINATE     >1.00 S/CO          REACTIVE     The following results were obtained with the IMMULITE 2000 Syphilis     Screen  chemiluminescent EIA. Results obtained from other     manufacturers' assay methods may not be used interchangeably.     Performed at [facility]  BASIC METABOLIC PANEL     Status: Abnormal   Collection Time    02/20/13  7:05 PM      Result Value Range   Sodium 127 (*) 135 - 145 mEq/L   Potassium 3.3 (*) 3.5 - 5.1 mEq/L   Chloride 98  96 - 112 mEq/L   CO2 22  19 - 32 mEq/L   Glucose, Bld 107 (*) 70 - 99 mg/dL   BUN 9  6 - 23 mg/dL   Creatinine, Ser 1.61  0.50 - 1.35 mg/dL   Calcium 7.2 (*) 8.4 - 10.5 mg/dL   GFR calc non Af Amer >90  >90 mL/min   GFR calc Af Amer >90  >90 mL/min   Comment: (NOTE)     The eGFR has been calculated using the CKD EPI equation.     This calculation has not been validated in all clinical situations.     eGFR's persistently <90 mL/min signify possible Chronic Kidney     Disease.  URINE RAPID DRUG SCREEN (HOSP PERFORMED)     Status: None   Collection Time    02/20/13 11:13 PM      Result Value Range   Opiates NONE DETECTED  NONE DETECTED   Cocaine NONE DETECTED  NONE DETECTED   Benzodiazepines NONE DETECTED  NONE DETECTED   Amphetamines NONE DETECTED  NONE DETECTED   Tetrahydrocannabinol NONE DETECTED  NONE DETECTED   Barbiturates NONE DETECTED  NONE DETECTED   Comment:            DRUG SCREEN FOR MEDICAL PURPOSES     ONLY.  IF CONFIRMATION IS NEEDED     FOR ANY PURPOSE, NOTIFY LAB     WITHIN 5 DAYS.                LOWEST DETECTABLE LIMITS     FOR URINE DRUG SCREEN     Drug Class       Cutoff (ng/mL)     Amphetamine      1000     Barbiturate      200     Benzodiazepine    200     Tricyclics       300     Opiates          300     Cocaine          300     THC              50  CORTISOL     Status: None   Collection Time    02/20/13 11:16 PM      Result Value Range   Cortisol, Plasma 8.5     Comment: (NOTE)     AM:  4.3 - 22.4 ug/dL     PM:  3.1 - 09.6 ug/dL     Performed at [facility]  LACTIC ACID, PLASMA     Status: None   Collection Time    02/20/13 11:16 PM      Result Value Range   Lactic Acid, Venous 0.6  0.5 - 2.2 mmol/L  CBC     Status: Abnormal   Collection Time    02/21/13 10:40 AM      Result Value Range   WBC 6.7  4.0 - 10.5 K/uL   RBC 2.96 (*)  4.22 - 5.81 MIL/uL   Hemoglobin 9.1 (*) 13.0 - 17.0 g/dL   HCT 16.1 (*) 09.6 - 04.5 %   MCV 89.9  78.0 - 100.0 fL   MCH 30.7  26.0 - 34.0 pg   MCHC 34.2  30.0 - 36.0 g/dL   RDW 40.9 (*) 81.1 - 91.4 %   Platelets 142 (*) 150 - 400 K/uL  COMPREHENSIVE METABOLIC PANEL     Status: Abnormal   Collection Time    02/21/13 10:40 AM      Result Value Range   Sodium 135  135 - 145 mEq/L   Potassium 4.2  3.5 - 5.1 mEq/L   Chloride 107  96 - 112 mEq/L   CO2 21  19 - 32 mEq/L   Glucose, Bld 111 (*) 70 - 99 mg/dL   BUN 7  6 - 23 mg/dL   Creatinine, Ser 7.82  0.50 - 1.35 mg/dL   Calcium 7.7 (*) 8.4 - 10.5 mg/dL   Total Protein 7.6  6.0 - 8.3 g/dL   Albumin 1.7 (*) 3.5 - 5.2 g/dL   AST 24  0 - 37 U/L   ALT 12  0 - 53 U/L   Alkaline Phosphatase 47  39 - 117 U/L   Total Bilirubin 0.4  0.3 - 1.2 mg/dL   GFR calc non Af Amer >90  >90 mL/min   GFR calc Af Amer >90  >90 mL/min   Comment: (NOTE)     The eGFR has been calculated using the CKD EPI equation.     This calculation has not been validated in all clinical situations.     eGFR's persistently <90 mL/min signify possible Chronic Kidney     Disease.  MAGNESIUM     Status: None   Collection Time    02/21/13 10:40 AM      Result Value Range   Magnesium 2.1  1.5 - 2.5 mg/dL  CORTISOL     Status: None   Collection Time    02/21/13  10:40 AM      Result Value Range   Cortisol, Plasma 107.5     Comment: (NOTE)     Result confirmed by automatic dilution.     AM:  4.3 - 22.4 ug/dL     PM:  3.1 - 95.6 ug/dL     Performed at [facility]  PROCALCITONIN     Status: None   Collection Time    02/21/13 10:40 AM      Result Value Range   Procalcitonin 8.07     Comment:            Interpretation:     PCT > 2 ng/mL:     Systemic infection (sepsis) is likely,     unless other causes are known.     (NOTE)             ICU PCT Algorithm               Non ICU PCT Algorithm        ----------------------------     ------------------------------             PCT < 0.25 ng/mL                 PCT < 0.1 ng/mL         Stopping of antibiotics            Stopping of antibiotics  strongly encouraged.               strongly encouraged.        ----------------------------     ------------------------------           PCT level decrease by               PCT < 0.25 ng/mL           >= 80% from peak PCT           OR PCT 0.25 - 0.5 ng/mL          Stopping of antibiotics                                                 encouraged.         Stopping of antibiotics               encouraged.        ----------------------------     ------------------------------           PCT level decrease by              PCT >= 0.25 ng/mL           < 80% from peak PCT            AND PCT >= 0.5 ng/mL            Continuing antibiotics                                                  encouraged.           Continuing antibiotics                encouraged.        ----------------------------     ------------------------------         PCT level increase compared          PCT > 0.5 ng/mL             with peak PCT AND              PCT >= 0.5 ng/mL             Escalation of antibiotics                                              strongly encouraged.          Escalation of antibiotics            strongly encouraged.   Dg Chest 2 View  02/20/2013    CLINICAL DATA:  Chest pain with fever, nausea/vomiting  EXAM: CHEST  2 VIEW  COMPARISON:  None.  FINDINGS: Lungs are clear. No pleural effusion or pneumothorax.  The heart is normal in size.  Visualized osseous structures are within normal limits.  IMPRESSION: Normal chest radiographs.   Electronically Signed   By: Charline Bills M.D.   On: 02/20/2013 10:26   Ct Abdomen Pelvis W Contrast  02/21/2013   CLINICAL DATA:  22 year old with positive Murphy's sign and  suprapubic tenderness.  EXAM: CT ABDOMEN AND PELVIS WITH CONTRAST  TECHNIQUE: Multidetector CT imaging of the abdomen and pelvis was performed using the standard protocol following bolus administration of intravenous contrast.  CONTRAST:  OMNIPAQUE IOHEXOL 300 MG/ML  SOLN  COMPARISON:  01/15/2013  FINDINGS: Mild haziness at the left lung base is suggestive for mild atelectasis. There is no evidence for free intraperitoneal air.  There is at least mild gallbladder wall thickening. Difficult to evaluate for gallstones on CT. There is mild periportal edema which is new or more conspicuous compared to the previous examination. Again noted is a prominent spleen measuring 13.7 x 13.9 x 6.9 cm. Estimated splenic volume is 657 ml. No gross abnormality to the pancreas or adrenal tissue. The liver is large measuring 22.2 cm in the craniocaudal dimension. The left kidney has a normal appearance.  The right kidney has a very heterogeneous perfusion pattern and findings are concerning for pyelonephritis. In addition, there is a new 1.8 cm low-density structure along the posterior right kidney upper pole. There are two adjacent low density structures along the medial aspect of the left kidney measuring 1.5 and 0.8 cm. These low density structures have rapidly developed since 01/15/2013 and highly concerning for an inflammatory process and abscess collections. The right kidney has enlarged compared to the prior examination, measuring 13.7 cm in length and  previously measuring 11.6 cm.  No definite free fluid or lymphadenopathy. Prominent vessels in the pelvis are poorly characterized on this examination. Small amount of fluid in the urinary bladder. There appears to be gas and contrast within the appendix but this area is difficult to visualize due to the lack of intra-abdominal fat.  Small amount of gas within the right anterior subcutaneous tissues are probably related to an injection site. Small bilateral inguinal lymph nodes are stable from the previous examination.  No acute bone abnormality.  IMPRESSION: Enlargement and heterogeneous appearance of the right kidney with new small low-density structures in the upper pole. Findings are most compatible with acute pyelonephritis and probable small abscesses.  Mild wall thickening in the gallbladder with periportal edema. Consider further evaluation with an ultrasound to evaluate for gallstones.  Hepatomegaly and splenomegaly.  These results were called by telephone at the time of interpretation on 02/21/2013 at 8:16 AM to Dr. Anselm Lis, who verbally acknowledged these results.   Electronically Signed   By: Richarda Overlie M.D.   On: 02/21/2013 08:17   Review of Systems  Constitutional: Positive for fever, chills, malaise/fatigue and diaphoresis. Negative for weight loss.  HENT: Negative.   Eyes: Negative.   Respiratory: Negative.   Cardiovascular: Negative.   Gastrointestinal: Positive for constipation and blood in stool. Negative for heartburn, nausea, vomiting, abdominal pain, diarrhea and melena.  Genitourinary: Positive for dysuria, urgency and frequency. Negative for hematuria and flank pain.  Musculoskeletal: Negative.   Skin: Negative.   Neurological: Positive for weakness.  Endo/Heme/Allergies: Negative.   Psychiatric/Behavioral: Negative.    Blood pressure 98/64, pulse 72, temperature 97.4 F (36.3 C), temperature source Oral, resp. rate 20, height 5\' 9"  (1.753 m), weight 60.782 kg (134 lb),  SpO2 100.00%. Physical Exam  Constitutional: He is oriented to person, place, and time. He appears well-developed and well-nourished.  HENT:  Head: Normocephalic and atraumatic.  Eyes: Conjunctivae and EOM are normal. Pupils are equal, round, and reactive to light.  Neck: Normal range of motion. Neck supple.  Cardiovascular: Normal rate, regular rhythm and normal heart sounds.   Respiratory: Effort normal  and breath sounds normal.  GI: Soft. Bowel sounds are normal. He exhibits no distension. There is no tenderness. There is no rebound and no guarding.  Musculoskeletal: Normal range of motion.  Neurological: He is alert and oriented to person, place, and time.  Skin: Skin is warm and dry.  Multiple tattoes noted  Psychiatric: He has a normal mood and affect. His behavior is normal. Judgment and thought content normal.   Assessment/Plan: 1) Acute pyelonephritis with abscesses noted in the right kidney on CT and positive blood cultures in HIV positive patient: would treat aggressively with BSA as advised by Dr. Ninetta Lights.  2) Change in bowel habits-constipation with anemia and rectal bleeding; this can be addressed once his acute symptoms/sepsis resolves. Continue PEG for symptomatic relief from the constipation. 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  4) HCV Ab positive. Check HCV RNA levels. 5) Syphilis with TP-Abs positive and a reactive RPR.  6) Severe malnutrition with an albumin of 1.7 gm/dl.   Jay Simon 02/21/2013, 6:51 PM

## 2013-02-21 NOTE — Consult Note (Signed)
INFECTIOUS DISEASE CONSULT NOTE  Date of Admission:  02/20/2013  Date of Consult:  02/21/2013  Reason for Consult: HIV+ Referring Physician: Randolm Idol  Impression/Recommendation HIV+ UTI Sepsis Hyponatremia Diarrhea  UCx is growing E coli. Would change his anbx to ceftriaxone. Check HIV RNA and CD4 Check urine gc/chlamydia Start Hep A and B vaccines Flu and pneumonia vaccines Check C diff, stop flagyl asap  Comment Pt with previous reluctance to begin process of dealing with his HIV status. He is now ready to engage the process.   Thank you so much for this interesting consult,   Jay Simon (pager) [phone] [email or web address]  Jay Simon is an 22 y.o. male.  HPI: 22 yo M with hx of prev rectal warts 2012, Syphillis (11-2012), AIDS, (dx with HIV+ 2011 (current MSM partner is HIV+). CD4 at that time was 164 per pt. Has not yet had ID f/u in [place].  Comes to ED with hypotension, tachypnea, tachycardia and hypoNatremia. He was seen ~ 1 month ago with sx of UTi and given rx for keflex. Since that time he has had worsening dysuria, frequency, urgency. He had temp to 102 at home as well as n/v. In ED he was found to have normal WBC and his temp has been up to 104. UA showed TNTC wbc.    Past Medical History  Diagnosis Date  . HIV disease 2011  . Syphilis 2014  . Anal condyloma     Past Surgical History  Procedure Laterality Date  . Colon surgery       Allergies  Allergen Reactions  . Risperdal [Risperidone] Anaphylaxis  . Shellfish Allergy Anaphylaxis    Medications:  Scheduled: . heparin subcutaneous  5,000 Units Subcutaneous Q8H  . metronidazole  500 mg Intravenous Q8H  . piperacillin-tazobactam (ZOSYN)  IV  3.375 g Intravenous Q8H  . sodium chloride  3 mL Intravenous Q12H  . vancomycin  1,000 mg Intravenous Q8H    Total days of antibiotics: 2 cefotaxime          Social History:  reports that he has quit smoking. His smoking use included  Cigarettes. He smoked 0.00 packs per day. He does not have any smokeless tobacco history on file. He reports that he drinks alcohol. His drug history is not on file.  Family History  Problem Relation Age of Onset  . Diabetes Mother   . Diabetes Maternal Grandmother   . Diabetes Maternal Grandfather   . Cancer Maternal Grandmother     breast     General ROS: continued polyuria, no further n/v, continued f/c, diarrhea, no thrush or oral ulcers, no LAN. see HPI.   Blood pressure 98/64, pulse 72, temperature 97.4 F (36.3 C), temperature source Oral, resp. rate 20, height 5\' 9"  (1.753 m), weight 60.782 kg (134 lb), SpO2 100.00%. General appearance: alert, cooperative and no distress Eyes: negative findings: pupils equal, round, reactive to light and accomodation Throat: ? thrush on tongue Neck: no adenopathy and supple, symmetrical, trachea midline Lungs: clear to auscultation bilaterally Heart: regular rate and rhythm Abdomen: normal findings: bowel sounds normal and soft, non-tender Extremities: edema none Lymph nodes: Cervical, supraclavicular, and axillary nodes normal. Neurologic: Grossly normal   Results for orders placed during the hospital encounter of 02/20/13 (from the past 48 hour(s))  CBC WITH DIFFERENTIAL     Status: Abnormal   Collection Time    02/20/13  9:40 AM      Result Value Range   WBC 9.5  4.0 - 10.5 K/uL  RBC 3.40 (*) 4.22 - 5.81 MIL/uL   Hemoglobin 10.4 (*) 13.0 - 17.0 g/dL   HCT 56.2 (*) 13.0 - 86.5 %   MCV 89.1  78.0 - 100.0 fL   MCH 30.6  26.0 - 34.0 pg   MCHC 34.3  30.0 - 36.0 g/dL   RDW 78.4 (*) 69.6 - 29.5 %   Platelets 177  150 - 400 K/uL   Neutrophils Relative % 67  43 - 77 %   Neutro Abs 6.4  1.7 - 7.7 K/uL   Lymphocytes Relative 17  12 - 46 %   Lymphs Abs 1.6  0.7 - 4.0 K/uL   Monocytes Relative 16 (*) 3 - 12 %   Monocytes Absolute 1.5 (*) 0.1 - 1.0 K/uL   Eosinophils Relative 0  0 - 5 %   Eosinophils Absolute 0.0  0.0 - 0.7 K/uL    Basophils Relative 0  0 - 1 %   Basophils Absolute 0.0  0.0 - 0.1 K/uL  COMPREHENSIVE METABOLIC PANEL     Status: Abnormal   Collection Time    02/20/13  9:40 AM      Result Value Range   Sodium 124 (*) 135 - 145 mEq/L   Potassium 3.4 (*) 3.5 - 5.1 mEq/L   Chloride 89 (*) 96 - 112 mEq/L   CO2 24  19 - 32 mEq/L   Glucose, Bld 101 (*) 70 - 99 mg/dL   BUN 13  6 - 23 mg/dL   Creatinine, Ser 2.84  0.50 - 1.35 mg/dL   Calcium 8.3 (*) 8.4 - 10.5 mg/dL   Total Protein 9.7 (*) 6.0 - 8.3 g/dL   Albumin 2.4 (*) 3.5 - 5.2 g/dL   AST 24  0 - 37 U/L   ALT 13  0 - 53 U/L   Alkaline Phosphatase 49  39 - 117 U/L   Total Bilirubin 0.4  0.3 - 1.2 mg/dL   GFR calc non Af Amer 87 (*) >90 mL/min   GFR calc Af Amer >90  >90 mL/min   Comment: (NOTE)     The eGFR has been calculated using the CKD EPI equation.     This calculation has not been validated in all clinical situations.     eGFR's persistently <90 mL/min signify possible Chronic Kidney     Disease.  LIPASE, BLOOD     Status: None   Collection Time    02/20/13  9:40 AM      Result Value Range   Lipase 12  11 - 59 U/L  LACTIC ACID, PLASMA     Status: None   Collection Time    02/20/13  9:41 AM      Result Value Range   Lactic Acid, Venous 1.4  0.5 - 2.2 mmol/L  CULTURE, BLOOD (ROUTINE X 2)     Status: None   Collection Time    02/20/13 10:00 AM      Result Value Range   Specimen Description BLOOD LEFT ARM     Special Requests BOTTLES DRAWN AEROBIC AND ANAEROBIC B10CC R5CC     Culture  Setup Time       Value: 02/20/2013 12:54     Performed at [facility]   Culture       Value: GRAM NEGATIVE RODS     Note: Gram Stain Report Called to,Read Back By and Verified With: Jerl Mina RN on 02/21/13 at 00:35 by Christie Nottingham     Performed at [facility]   Report Status PENDING    CULTURE, BLOOD (ROUTINE X 2)     Status: None   Collection Time    02/20/13 10:10 AM      Result Value Range   Specimen Description BLOOD LEFT  HAND     Special Requests BOTTLES DRAWN AEROBIC ONLY 10CC     Culture  Setup Time       Value: 02/20/2013 12:54     Performed at [facility]   Culture       Value:        BLOOD CULTURE RECEIVED NO GROWTH TO DATE CULTURE WILL BE HELD FOR 5 DAYS BEFORE ISSUING A FINAL NEGATIVE REPORT     Performed at [facility]   Report Status PENDING    OCCULT BLOOD, POC DEVICE     Status: Abnormal   Collection Time    02/20/13 11:22 AM      Result Value Range   Fecal Occult Bld POSITIVE (*) NEGATIVE  URINALYSIS, ROUTINE W REFLEX MICROSCOPIC     Status: Abnormal   Collection Time    02/20/13 11:29 AM      Result Value Range   Color, Urine YELLOW  YELLOW   APPearance CLEAR  CLEAR   Specific Gravity, Urine 1.014  1.005 - 1.030   pH 5.5  5.0 - 8.0   Glucose, UA NEGATIVE  NEGATIVE mg/dL   Hgb urine dipstick SMALL (*) NEGATIVE   Bilirubin Urine NEGATIVE  NEGATIVE   Ketones, ur NEGATIVE  NEGATIVE mg/dL   Protein, ur 30 (*) NEGATIVE mg/dL   Urobilinogen, UA 1.0  0.0 - 1.0 mg/dL   Nitrite POSITIVE (*) NEGATIVE   Leukocytes, UA LARGE (*) NEGATIVE  URINE MICROSCOPIC-ADD ON     Status: Abnormal   Collection Time    02/20/13 11:29 AM      Result Value Range   WBC, UA TOO NUMEROUS TO COUNT  <3 WBC/hpf   RBC / HPF 3-6  <3 RBC/hpf   Bacteria, UA MANY (*) RARE  URINE CULTURE     Status: None   Collection Time    02/20/13 11:29 AM      Result Value Range   Specimen Description URINE, RANDOM     Special Requests NONE     Culture  Setup Time       Value: 02/20/2013 17:04     Performed at [facility] Count       Value: >=100,000 COLONIES/ML     Performed at [facility]   Culture       Value: ESCHERICHIA COLI     Performed at [facility]   Report Status PENDING    BASIC METABOLIC PANEL     Status: Abnormal   Collection Time    02/20/13  4:26 PM      Result Value Range   Sodium 129 (*) 135 - 145 mEq/L   Potassium 3.3 (*) 3.5 - 5.1 mEq/L    Chloride 98  96 - 112 mEq/L   Comment: DELTA CHECK NOTED   CO2 21  19 - 32 mEq/L   Glucose, Bld 96  70 - 99 mg/dL   BUN 10  6 - 23 mg/dL   Creatinine, Ser 1.61  0.50 - 1.35 mg/dL   Calcium 7.2 (*) 8.4 - 10.5 mg/dL   GFR calc non Af Amer >90  >90 mL/min   GFR calc Af Amer >90  >90 mL/min  Comment: (NOTE)     The eGFR has been calculated using the CKD EPI equation.     This calculation has not been validated in all clinical situations.     eGFR's persistently <90 mL/min signify possible Chronic Kidney     Disease.  RPR     Status: Abnormal   Collection Time    02/20/13  4:40 PM      Result Value Range   RPR Reactive (*) NON REACTIVE   Comment: *CONFIRMATORY TESTING TO FOLLOW*     Performed at [facility]  HEPATITIS A ANTIBODY, TOTAL     Status: None   Collection Time    02/20/13  4:40 PM      Result Value Range   Hep A Total Ab NON REACTIVE  NON REACTIVE   Comment: Performed at [facility]  HEPATITIS B SURFACE ANTIBODY     Status: None   Collection Time    02/20/13  4:40 PM      Result Value Range   Hep B S Ab INDETER  NEGATIVE   Comment: (NOTE)     Unable to determine if anti-HBs is present at levels consistent with     immunity.  Patient's immune status should be further assessed by     considering other clinical information.     Result repeated and verified.     Performed at [facility]  HEPATITIS B SURFACE ANTIGEN     Status: None   Collection Time    02/20/13  4:40 PM      Result Value Range   Hepatitis B Surface Ag NEGATIVE  NEGATIVE   Comment: Performed at [facility]  HEPATITIS B CORE ANTIBODY, TOTAL     Status: None   Collection Time    02/20/13  4:40 PM      Result Value Range   Hep B Core Total Ab NON REACTIVE  NON REACTIVE   Comment: Performed at [facility]  HEPATITIS C ANTIBODY     Status: Abnormal   Collection Time    02/20/13  4:40 PM      Result Value Range   HCV Ab Reactive (*) NEGATIVE   Comment:  (NOTE)                                                                               This test is for screening purposes only.  Reactive results should be     confirmed by an alternative method.  Suggest HCV Qualitative, PCR,     test code 60454.  Specimens will be stable for reflex testing up to 3     days after collection.     Performed at [facility]  RPR TITER     Status: Abnormal   Collection Time    02/20/13  4:40 PM      Result Value Range   RPR Titer 1:2 (*) NON REACTIVE   Comment: Performed at [facility]  T.PALLIDUM AB, IGG     Status: Abnormal   Collection Time    02/20/13  4:40 PM      Result Value Range   T pallidum Antibodies (TP-PA) >8.00 (*) <  0.90 S/CO   Comment: (NOTE)     <0.90 S/CO          NON REACTIVE     0.90-1.00 S/CO      INDETERMINATE     >1.00 S/CO          REACTIVE     The following results were obtained with the IMMULITE 2000 Syphilis     Screen chemiluminescent EIA. Results obtained from other     manufacturers' assay methods may not be used interchangeably.     Performed at [facility]  BASIC METABOLIC PANEL     Status: Abnormal   Collection Time    02/20/13  7:05 PM      Result Value Range   Sodium 127 (*) 135 - 145 mEq/L   Potassium 3.3 (*) 3.5 - 5.1 mEq/L   Chloride 98  96 - 112 mEq/L   CO2 22  19 - 32 mEq/L   Glucose, Bld 107 (*) 70 - 99 mg/dL   BUN 9  6 - 23 mg/dL   Creatinine, Ser 4.09  0.50 - 1.35 mg/dL   Calcium 7.2 (*) 8.4 - 10.5 mg/dL   GFR calc non Af Amer >90  >90 mL/min   GFR calc Af Amer >90  >90 mL/min   Comment: (NOTE)     The eGFR has been calculated using the CKD EPI equation.     This calculation has not been validated in all clinical situations.     eGFR's persistently <90 mL/min signify possible Chronic Kidney     Disease.  URINE RAPID DRUG SCREEN (HOSP PERFORMED)     Status: None   Collection Time    02/20/13 11:13 PM      Result Value Range   Opiates NONE DETECTED  NONE DETECTED   Cocaine  NONE DETECTED  NONE DETECTED   Benzodiazepines NONE DETECTED  NONE DETECTED   Amphetamines NONE DETECTED  NONE DETECTED   Tetrahydrocannabinol NONE DETECTED  NONE DETECTED   Barbiturates NONE DETECTED  NONE DETECTED   Comment:            DRUG SCREEN FOR MEDICAL PURPOSES     ONLY.  IF CONFIRMATION IS NEEDED     FOR ANY PURPOSE, NOTIFY LAB     WITHIN 5 DAYS.                LOWEST DETECTABLE LIMITS     FOR URINE DRUG SCREEN     Drug Class       Cutoff (ng/mL)     Amphetamine      1000     Barbiturate      200     Benzodiazepine   200     Tricyclics       300     Opiates          300     Cocaine          300     THC              50  CORTISOL     Status: None   Collection Time    02/20/13 11:16 PM      Result Value Range   Cortisol, Plasma 8.5     Comment: (NOTE)     AM:  4.3 - 22.4 ug/dL     PM:  3.1 - 81.1 ug/dL     Performed at [facility]  LACTIC ACID, PLASMA  Status: None   Collection Time    02/20/13 11:16 PM      Result Value Range   Lactic Acid, Venous 0.6  0.5 - 2.2 mmol/L  CBC     Status: Abnormal   Collection Time    02/21/13 10:40 AM      Result Value Range   WBC 6.7  4.0 - 10.5 K/uL   RBC 2.96 (*) 4.22 - 5.81 MIL/uL   Hemoglobin 9.1 (*) 13.0 - 17.0 g/dL   HCT 16.1 (*) 09.6 - 04.5 %   MCV 89.9  78.0 - 100.0 fL   MCH 30.7  26.0 - 34.0 pg   MCHC 34.2  30.0 - 36.0 g/dL   RDW 40.9 (*) 81.1 - 91.4 %   Platelets 142 (*) 150 - 400 K/uL  COMPREHENSIVE METABOLIC PANEL     Status: Abnormal   Collection Time    02/21/13 10:40 AM      Result Value Range   Sodium 135  135 - 145 mEq/L   Potassium 4.2  3.5 - 5.1 mEq/L   Chloride 107  96 - 112 mEq/L   CO2 21  19 - 32 mEq/L   Glucose, Bld 111 (*) 70 - 99 mg/dL   BUN 7  6 - 23 mg/dL   Creatinine, Ser 7.82  0.50 - 1.35 mg/dL   Calcium 7.7 (*) 8.4 - 10.5 mg/dL   Total Protein 7.6  6.0 - 8.3 g/dL   Albumin 1.7 (*) 3.5 - 5.2 g/dL   AST 24  0 - 37 U/L   ALT 12  0 - 53 U/L   Alkaline Phosphatase 47  39 - 117  U/L   Total Bilirubin 0.4  0.3 - 1.2 mg/dL   GFR calc non Af Amer >90  >90 mL/min   GFR calc Af Amer >90  >90 mL/min   Comment: (NOTE)     The eGFR has been calculated using the CKD EPI equation.     This calculation has not been validated in all clinical situations.     eGFR's persistently <90 mL/min signify possible Chronic Kidney     Disease.  MAGNESIUM     Status: None   Collection Time    02/21/13 10:40 AM      Result Value Range   Magnesium 2.1  1.5 - 2.5 mg/dL  PROCALCITONIN     Status: None   Collection Time    02/21/13 10:40 AM      Result Value Range   Procalcitonin 8.07     Comment:            Interpretation:     PCT > 2 ng/mL:     Systemic infection (sepsis) is likely,     unless other causes are known.     (NOTE)             ICU PCT Algorithm               Non ICU PCT Algorithm        ----------------------------     ------------------------------             PCT < 0.25 ng/mL                 PCT < 0.1 ng/mL         Stopping of antibiotics            Stopping of antibiotics           strongly encouraged.  strongly encouraged.        ----------------------------     ------------------------------           PCT level decrease by               PCT < 0.25 ng/mL           >= 80% from peak PCT           OR PCT 0.25 - 0.5 ng/mL          Stopping of antibiotics                                                 encouraged.         Stopping of antibiotics               encouraged.        ----------------------------     ------------------------------           PCT level decrease by              PCT >= 0.25 ng/mL           < 80% from peak PCT            AND PCT >= 0.5 ng/mL            Continuing antibiotics                                                  encouraged.           Continuing antibiotics                encouraged.        ----------------------------     ------------------------------         PCT level increase compared          PCT > 0.5 ng/mL              with peak PCT AND              PCT >= 0.5 ng/mL             Escalation of antibiotics                                              strongly encouraged.          Escalation of antibiotics            strongly encouraged.      Component Value Date/Time   SDES URINE, RANDOM 02/20/2013 1129   SPECREQUEST NONE 02/20/2013 1129   CULT  Value: ESCHERICHIA COLI Performed at [facility] 02/20/2013 1129   REPTSTATUS PENDING 02/20/2013 1129   Dg Chest 2 View  02/20/2013   CLINICAL DATA:  Chest pain with fever, nausea/vomiting  EXAM: CHEST  2 VIEW  COMPARISON:  None.  FINDINGS: Lungs are clear. No pleural effusion or pneumothorax.  The heart is normal in size.  Visualized osseous structures are within normal limits.  IMPRESSION: Normal chest radiographs.   Electronically Signed   By: Roselie Awkward.D.  On: 02/20/2013 10:26   Ct Abdomen Pelvis W Contrast  02/21/2013   CLINICAL DATA:  22 year old with positive Murphy's sign and suprapubic tenderness.  EXAM: CT ABDOMEN AND PELVIS WITH CONTRAST  TECHNIQUE: Multidetector CT imaging of the abdomen and pelvis was performed using the standard protocol following bolus administration of intravenous contrast.  CONTRAST:  OMNIPAQUE IOHEXOL 300 MG/ML  SOLN  COMPARISON:  01/15/2013  FINDINGS: Mild haziness at the left lung base is suggestive for mild atelectasis. There is no evidence for free intraperitoneal air.  There is at least mild gallbladder wall thickening. Difficult to evaluate for gallstones on CT. There is mild periportal edema which is new or more conspicuous compared to the previous examination. Again noted is a prominent spleen measuring 13.7 x 13.9 x 6.9 cm. Estimated splenic volume is 657 ml. No gross abnormality to the pancreas or adrenal tissue. The liver is large measuring 22.2 cm in the craniocaudal dimension. The left kidney has a normal appearance.  The right kidney has a very heterogeneous perfusion pattern and findings are  concerning for pyelonephritis. In addition, there is a new 1.8 cm low-density structure along the posterior right kidney upper pole. There are two adjacent low density structures along the medial aspect of the left kidney measuring 1.5 and 0.8 cm. These low density structures have rapidly developed since 01/15/2013 and highly concerning for an inflammatory process and abscess collections. The right kidney has enlarged compared to the prior examination, measuring 13.7 cm in length and previously measuring 11.6 cm.  No definite free fluid or lymphadenopathy. Prominent vessels in the pelvis are poorly characterized on this examination. Small amount of fluid in the urinary bladder. There appears to be gas and contrast within the appendix but this area is difficult to visualize due to the lack of intra-abdominal fat.  Small amount of gas within the right anterior subcutaneous tissues are probably related to an injection site. Small bilateral inguinal lymph nodes are stable from the previous examination.  No acute bone abnormality.  IMPRESSION: Enlargement and heterogeneous appearance of the right kidney with new small low-density structures in the upper pole. Findings are most compatible with acute pyelonephritis and probable small abscesses.  Mild wall thickening in the gallbladder with periportal edema. Consider further evaluation with an ultrasound to evaluate for gallstones.  Hepatomegaly and splenomegaly.  These results were called by telephone at the time of interpretation on 02/21/2013 at 8:16 AM to Dr. Anselm Lis, who verbally acknowledged these results.   Electronically Signed   By: Richarda Overlie M.D.   On: 02/21/2013 08:17   Recent Results (from the past 240 hour(s))  CULTURE, BLOOD (ROUTINE X 2)     Status: None   Collection Time    02/20/13 10:00 AM      Result Value Range Status   Specimen Description BLOOD LEFT ARM   Final   Special Requests BOTTLES DRAWN AEROBIC AND ANAEROBIC B10CC [facility]   Final    Culture  Setup Time     Final   Value: 02/20/2013 12:54     Performed at [facility]   Culture     Final   Value: GRAM NEGATIVE RODS     Note: Gram Stain Report Called to,Read Back By and Verified With: Jerl Mina RN on 02/21/13 at 00:35 by Christie Nottingham     Performed at [facility]   Report Status PENDING   Incomplete  CULTURE, BLOOD (ROUTINE X 2)     Status: None  Collection Time    02/20/13 10:10 AM      Result Value Range Status   Specimen Description BLOOD LEFT HAND   Final   Special Requests BOTTLES DRAWN AEROBIC ONLY 10CC   Final   Culture  Setup Time     Final   Value: 02/20/2013 12:54     Performed at [facility]   Culture     Final   Value:        BLOOD CULTURE RECEIVED NO GROWTH TO DATE CULTURE WILL BE HELD FOR 5 DAYS BEFORE ISSUING A FINAL NEGATIVE REPORT     Performed at [facility]   Report Status PENDING   Incomplete  URINE CULTURE     Status: None   Collection Time    02/20/13 11:29 AM      Result Value Range Status   Specimen Description URINE, RANDOM   Final   Special Requests NONE   Final   Culture  Setup Time     Final   Value: 02/20/2013 17:04     Performed at [facility] Count     Final   Value: >=100,000 COLONIES/ML     Performed at [facility]   Culture     Final   Value: ESCHERICHIA COLI     Performed at [facility]   Report Status PENDING   Incomplete      02/21/2013, 3:41 PM     LOS: 1 day

## 2013-02-21 NOTE — Progress Notes (Signed)
Family Medicine Teaching Service Daily Progress Note Intern Pager: [phone]  Patient name: Jay Simon Medical record number: [id number] Date of birth: 05/25/1990 Age: 22 y.o. Gender: male  Primary Care Provider: No PCP Per Patient Consultants: ID, GI, CCM Code Status: Full Code  Pt Overview and Major Events to Date:  11/30 Admitted with pyelonephritis/sepsis  Assessment and Plan: Mr. Frayre is a 22yo male with a past medical history of recently diagnosed HIV, who presented with fevers, night sweats, and flank pain.  UTI/Pyelonephritis/Sepsis: Urine and blood cultures grew gram negative rods.  Pt was febrile once overnight, but has had several episodes of tachycardia and soft BPs since admission.  Also, 2 small renal abcesses were noted on CT scan.   - continue Vanc 100mg  q8 IV, Zosyn 3.375g IV q8, and Flagyl 500mg  IV q8 - obtain travel history  HIV positive: Pt recently diagnosed and had an appt with the ID clinic soon, but has not seen them yet - consult ID - CD4 and viral load pending  Hyponatremia: Pt arrived with hyponatremia with unclear etiology; he was given NS.  127 on 11/30, 135 on 12/1 - continue  265ml/hr NS - continue to monitor  Anemia: Pt Hgb trending downward 12.5 -> 10.4 -> 9.1.  Pt has history of colon cancer per pt with 2 polyps removed in past. - GI consult - continue to monitor Hgb trend closely  Syphilis Reactive: Titer 1:2 indicating treated infection  Hep C reactive:  - f/u viral count and genotype   FEN/GI: Full diet PPx: heparin  Disposition: Floor status, low threshold for transferring to ICU if needed  Subjective: Pt doing well this morning.  No pain noted.  No dizziness or lightheadedness.    Objective: Temp:  [97.4 F (36.3 C)-104 F (40 C)] 97.7 F (36.5 C) (12/01 0935) Pulse Rate:  [86-137] 88 (12/01 0935) Resp:  [16-29] 18 (12/01 0935) BP: (70-108)/(41-70) 97/66 mmHg (12/01 0935) SpO2:  [91 %-100 %] 100 % (12/01 0500) Weight:   [134 lb (60.782 kg)] 134 lb (60.782 kg) (11/30 1625)  Physical Exam: General: no acute distress, lying comfortably in bed in hospital gown Cardiovascular: regular rate and rhythm, S1S2, no murmurs Respiratory: lungs clear to auscultation bilaterally Abdomen: nontender, nondistended, normoactive bowel sounds Extremities: no edema bilaterally  Laboratory:  Recent Labs Lab 02/20/13 0940 02/21/13 1040  WBC 9.5 6.7  HGB 10.4* 9.1*  HCT 30.3* 26.6*  PLT 177 142*    Recent Labs Lab 02/20/13 0940 02/20/13 1626 02/20/13 1905 02/21/13 1040  NA 124* 129* 127* 135  K 3.4* 3.3* 3.3* 4.2  CL 89* 98 98 107  CO2 24 21 22 21   BUN 13 10 9 7   CREATININE 1.16 0.89 0.86 0.61  CALCIUM 8.3* 7.2* 7.2* 7.7*  PROT 9.7*  --   --  7.6  BILITOT 0.4  --   --  0.4  ALKPHOS 49  --   --  47  ALT 13  --   --  12  AST 24  --   --  24  GLUCOSE 101* 96 107* 111*   Cortisol: 8.5 (11/30) UDS: clean (11/30) RPR: reactive 1:2 (11/30) Hep A: nonreactive (11/30) Hep B: nonreactive (11/30) Hep C: reactive (11/30) UA: positive nitrites, large leukocytes (11/30) FOBT: positive (11/30) U Cx: gram negative rods (11/30) Blood Cx: gram negative rods (11/30)  Imaging/Diagnostic Tests: 11/30 CT Abdomen and Pelvis: Enlargement and heterogeneous appearance of the right kidney with new small low-density structures in the upper pole.  Findings are most compatible with acute pyelonephritis and probable small abscesses.  Mild wall thickening in the gallbladder with periportal edema.  Consider further evaluation with an ultrasound to evaluate for gallstones.  Hepatomegaly and splenomegaly.   Rayburn Ma, Med Student 02/21/2013, 12:36 PM [facility] FPTS Intern pager: [phone] text pages welcome  Upper Level Addendum:  S: patient states feeling improved this morning. Notes minimal belly pain. Has been taking clear liquids ok.   O: PE: Gen: No acute distress, laying comfortably in  bed CV: rrr, no mrg noted Pulm: CTAB, no wheezes or crackles noted Abd: soft, minimally tender in RUQ, mild pain with murphy sign, liver palpable below the costal margin  A/P: patient is a 22 yo male with recently diagnosed HIV and PMH of ?colon cancer who presents with sepsis secondary to pyelonephritis.  # Pyelonephritis/sepsis: patient found to have E coli in urine and gram negative rods in blood. Patient with fluid resuscitation of 4 1 L boluses and IVF @ 250 mL/hr, with mild improvement in blood pressures. CT scan with noted probable small abscesses. - continue vanc, zosyn, and flagyl for now - will narrow antibiotics once sensitivities return on e coli - will discuss travel history with the patient - given small size of abscesses, these can be treated with antibiotics - cortisol level within the normal range - can d/c stress dose steroids at this time - f/u procalcitonin - trend WBC - appreciate CCM eval of patient.  # HIV: patient recently diagnosed. Has yet to have ID follow-up for this issue. Last CD4 count 310. - will follow-up on CD4 count and viral load. - ID consulted on admission, will follow-up for their rec's  # Anemia: patient with decline in Hgb over the past month from 12.5 to 9.1. Normocytic. Has history of colon cancer and found to be FOBT +. Some measure may be dilutional from yesterday given drop of all cell lines. - will continue to trend CBC - GI consulted and to see the patient to evaluate given FOBT + and history of reported colon cancer  # Hep C positive: LFTs stable. Will follow-up on genotype. ID to follow.  # Hyponatremia: 124 on admission, now 135. Will continue to monitor.  FEN/GI: reg diet, decrease fluid to NS 150 mL/hr, stopped potassium supplement in fluids as K normalized Dispo: patient has stabilized. ID and GI to see the patient. Discharge pending continued improvement.  Marikay Alar, MD Family Medicine PGY-2

## 2013-02-21 NOTE — Consult Note (Signed)
PULMONARY  / CRITICAL CARE MEDICINE  Name: Jay Simon MRN: [id number] DOB: November 02, 1990    ADMISSION DATE:  02/20/2013 CONSULTATION DATE:  12-1  REFERRING MD :  TS PRIMARY SERVICE: TS  CHIEF COMPLAINT:  Hypotension  BRIEF PATIENT DESCRIPTION:  22 yo male who with hx of Syphilis (treated) and HIV with CD4 count>300 who presents with recurrent flank pain(treated 10-25 [address] [place] with keflex) sweats and hypotension. He has pmh significant for cancerous colon polyps dx in 2012.  He has remained hypotensive(90/50 despite IVF (but is a net negative I/O). PCCM asked to evaluate. SIGNIFICANT EVENTS / STUDIES:    LINES / TUBES:   CULTURES: 11-30 uc>> 11-30 BC>>    ANTIBIOTICS: 11-30 flagyl>> 11-20 zoysn>> 11=30 vanco>>  HISTORY OF PRESENT ILLNESS:   22 yo male who with hx of Syphilis (treated) and HIV with CD4 count>300 who presents with recurrent flank pain(treated 10-25 [place] with keflex) sweats and hypotension. He has pmh significant for cancerous colon polyps dx in 2012.  He has remained hypotensive(90/50 despite IVF (but is a net negative I/O). PCCM asked to evaluate.  PAST MEDICAL HISTORY :  Past Medical History  Diagnosis Date  . HIV disease    Past Surgical History  Procedure Laterality Date  . Colon surgery     Prior to Admission medications   Medication Sig Start Date End Date Taking? Authorizing Provider  OVER THE COUNTER MEDICATION Take 2 tablets by mouth daily as needed (pain). [employer]   Yes Historical Provider, MD   Allergies  Allergen Reactions  . Risperdal [Risperidone] Anaphylaxis  . Shellfish Allergy Anaphylaxis    FAMILY HISTORY:  No family history on file. SOCIAL HISTORY:  reports that he has quit smoking. His smoking use included Cigarettes. He smoked 0.00 packs per day. He does not have any smokeless tobacco history on file. He reports that he drinks alcohol. His drug history is not on file.  REVIEW OF SYSTEMS:   10 point review of  system taken, please see HPI for positives and negatives.   SUBJECTIVE:  NAD  VITAL SIGNS: Temp:  [97.4 F (36.3 C)-104 F (40 C)] 97.4 F (36.3 C) (12/01 0500) Pulse Rate:  [86-139] 86 (12/01 0500) Resp:  [16-29] 16 (12/01 0500) BP: (70-118)/(41-73) 90/50 mmHg (12/01 0640) SpO2:  [91 %-100 %] 100 % (12/01 0500) Weight:  [134 lb (60.782 kg)-142 lb (64.411 kg)] 134 lb (60.782 kg) (11/30 1625) HEMODYNAMICS:   VENTILATOR SETTINGS:   INTAKE / OUTPUT: Intake/Output     11/30 0701 - 12/01 0700 12/01 0701 - 12/02 0700   P.O. 800    I.V. (mL/kg) 2095.8 (34.5)    IV Piggyback 100    Total Intake(mL/kg) 2995.8 (49.3)    Urine (mL/kg/hr) 2300 300 (2.7)   Emesis/NG output 40    Total Output 2340 300   Net +655.8 -300        Urine Occurrence 901 x    Stool Occurrence 1 x      PHYSICAL EXAMINATION: General:  Thin aam , awake and interactive Neuro: Intact, MAE x 4, follows all commands HEENT:  No JVD/LAN, oral pharynx dry. Cardiovascular:  HSR RRR ST Lungs:  Decreased bs bases Abdomen:  Soft non tender Musculoskeletal:  intact Skin:  Cool clammy  LABS:  CBC  Recent Labs Lab 02/20/13 0940  WBC 9.5  HGB 10.4*  HCT 30.3*  PLT 177   Coag's No results found for this basename: APTT, INR,  in the last  168 hours BMET  Recent Labs Lab 02/20/13 0940 02/20/13 1626 02/20/13 1905  NA 124* 129* 127*  K 3.4* 3.3* 3.3*  CL 89* 98 98  CO2 24 21 22   BUN 13 10 9   CREATININE 1.16 0.89 0.86  GLUCOSE 101* 96 107*   Electrolytes  Recent Labs Lab 02/20/13 0940 02/20/13 1626 02/20/13 1905  CALCIUM 8.3* 7.2* 7.2*   Sepsis Markers  Recent Labs Lab 02/20/13 0941 02/20/13 2316  LATICACIDVEN 1.4 0.6   ABG No results found for this basename: PHART, PCO2ART, PO2ART,  in the last 168 hours Liver Enzymes  Recent Labs Lab 02/20/13 0940  AST 24  ALT 13  ALKPHOS 49  BILITOT 0.4  ALBUMIN 2.4*   Cardiac Enzymes No results found for this basename: TROPONINI, PROBNP,   in the last 168 hours Glucose No results found for this basename: GLUCAP,  in the last 168 hours  Imaging Dg Chest 2 View  02/20/2013   CLINICAL DATA:  Chest pain with fever, nausea/vomiting  EXAM: CHEST  2 VIEW  COMPARISON:  None.  FINDINGS: Lungs are clear. No pleural effusion or pneumothorax.  The heart is normal in size.  Visualized osseous structures are within normal limits.  IMPRESSION: Normal chest radiographs.   Electronically Signed   By: Charline Bills M.D.   On: 02/20/2013 10:26   Ct Abdomen Pelvis W Contrast  02/21/2013   CLINICAL DATA:  22 year old with positive Murphy's sign and suprapubic tenderness.  EXAM: CT ABDOMEN AND PELVIS WITH CONTRAST  TECHNIQUE: Multidetector CT imaging of the abdomen and pelvis was performed using the standard protocol following bolus administration of intravenous contrast.  CONTRAST:  OMNIPAQUE IOHEXOL 300 MG/ML  SOLN  COMPARISON:  01/15/2013  FINDINGS: Mild haziness at the left lung base is suggestive for mild atelectasis. There is no evidence for free intraperitoneal air.  There is at least mild gallbladder wall thickening. Difficult to evaluate for gallstones on CT. There is mild periportal edema which is new or more conspicuous compared to the previous examination. Again noted is a prominent spleen measuring 13.7 x 13.9 x 6.9 cm. Estimated splenic volume is 657 ml. No gross abnormality to the pancreas or adrenal tissue. The liver is large measuring 22.2 cm in the craniocaudal dimension. The left kidney has a normal appearance.  The right kidney has a very heterogeneous perfusion pattern and findings are concerning for pyelonephritis. In addition, there is a new 1.8 cm low-density structure along the posterior right kidney upper pole. There are two adjacent low density structures along the medial aspect of the left kidney measuring 1.5 and 0.8 cm. These low density structures have rapidly developed since 01/15/2013 and highly concerning for an  inflammatory process and abscess collections. The right kidney has enlarged compared to the prior examination, measuring 13.7 cm in length and previously measuring 11.6 cm.  No definite free fluid or lymphadenopathy. Prominent vessels in the pelvis are poorly characterized on this examination. Small amount of fluid in the urinary bladder. There appears to be gas and contrast within the appendix but this area is difficult to visualize due to the lack of intra-abdominal fat.  Small amount of gas within the right anterior subcutaneous tissues are probably related to an injection site. Small bilateral inguinal lymph nodes are stable from the previous examination.  No acute bone abnormality.  IMPRESSION: Enlargement and heterogeneous appearance of the right kidney with new small low-density structures in the upper pole. Findings are most compatible with acute pyelonephritis and  probable small abscesses.  Mild wall thickening in the gallbladder with periportal edema. Consider further evaluation with an ultrasound to evaluate for gallstones.  Hepatomegaly and splenomegaly.  These results were called by telephone at the time of interpretation on 02/21/2013 at 8:16 AM to Dr. Anselm Lis, who verbally acknowledged these results.   Electronically Signed   By: Richarda Overlie M.D.   On: 02/21/2013 08:17     CXR: see above  ASSESSMENT / PLAN:  PULMONARY A:No acute issue P:   - Titrate O2 as needed.  CARDIOVASCULAR A: Shock from presumed sepsis from Urinary infection source. P:  - Agree with abx. - Fluid resuscitation. - Check procalcitonin. - Check cortisol for completeness and replace as needed.  RENAL Lab Results  Component Value Date   CREATININE 0.86 02/20/2013   CREATININE 0.89 02/20/2013   CREATININE 1.16 02/20/2013   A:  No acute issue P:   - F/U BMET.  GASTROINTESTINAL A:  Hx of colon polyps  P:   - GI protection  HEMATOLOGIC A:  No acute issue P:  - Monitor CBC.  INFECTIOUS A:   Presumed from urinary source in HIV patient. P:   - See flows - ID consults for HIV  ENDOCRINE A:  No acute issue  P:   - Check cortisol level.  NEUROLOGIC A:  No acute process P:   - Monitor.  TODAY'S SUMMARY:  22 yo male who with hx of Syphilis (treated) and HIV with CD4 count>300 who presents with recurrent flank pain(treated 10-25 [place] with keflex) sweats and hypotension. He has pmh significant for cancerous colon polyps dx in 2012.  He has remained hypotensive(90/50 despite IVF (but is a net negative I/O). PCCM asked to evaluate.  Brett Canales Minor ACNP [facility] PCCM Pager [phone] till 3 pm If no answer page [phone] 02/21/2013, 8:48 AM  Patient appears stable.  Check cortisol level.  Replace as needed.  Patient seen and examined, agree with above note.  I dictated the care and orders written for this patient under my direction.  Alyson Reedy, MD [phone]

## 2013-02-21 NOTE — Progress Notes (Signed)
Chaplain to visit patient via nurse request. Offered spiritual care. Patient said open to prayer but not at this time. Patient declined care.  Chaplain Jones Skene

## 2013-02-21 NOTE — Progress Notes (Addendum)
I spoke with the patient regarding his positive syphilis test.  He reports he was found to be positive in September and was treated at that time.  Titer is 1:2 indicating sucessful treatment.  Given potential re-infection blunted by his HIV status, his clinical presentation could represent a Jarisch-Herxeimer Reaction.  However given his abdominal exam findings plan to continue ABX  and obtain CT abd/pelvis as already planned.    Gram Negative Rod Bacteremia noted in 1/2 cultures.  Currently on Vancomycin, Zosyn (s/p Rocephin X1, Claforan), Flagyl (for enteric anerobes).  Likely plan to d/c Vancomycin in AM.     CT Scan from 10/25 noted.  Repeat imaging indicated to evaluate for potential pyelonephritis and for + murphy's sign.    Repeat lactic acid normal.  Anti-pyretics if febrile. BMET in AM.    Continue to monitor.  Andrena Mews, DO [facility] Resident - PGY-3 02/21/2013 2:10 AM

## 2013-02-21 NOTE — Progress Notes (Signed)
INITIAL NUTRITION ASSESSMENT  DOCUMENTATION CODES Per approved criteria  -Not Applicable   INTERVENTION: Discussed intake of high calorie, high protein foods to maximize intake and to promote weight maintenance/gain. Declined oral nutrition supplements. RD to continue to follow nutrition care plan.  NUTRITION DIAGNOSIS: Increased nutrient needs related to HIV and ?catabolic illnes as evidenced by estimated needs.   Goal: Intake to meet >90% of estimated nutrition needs.  Monitor:  weight trends, lab trends, I/O's, PO intake, supplement tolerance  Reason for Assessment: Malnutrition Screening Tool  22 y.o. male  Admitting Dx: UTI and syphilis  ASSESSMENT: PMHx significant for HIV (dx this summer but has not been treated) and colon CA (polyps removed in 2012, were found to be cancerous, did not follow-up.) Admitted with dysuria, urinary frequency and urgency, fever and chills. Work-up reveals syphilis.  Pt reports that his last good meal was on Thanksgiving Day. Didn't eat well on Friday - Sunday. Pt reports that his weight fluctuates from 135 - 145 lb. Currently 132 lb. Appears well-nourished - discussed need for high calorie/high protein foods to help prevent weight loss. Pt verbalized understanding, he notes that he and his partner are into healthy eating and he is trying his best to get all of his vitamins/minerals with his meals. Doesn't like oral nutrition supplements. Currently eating a cheeseburger and has eaten at least half of it.   Height: Ht Readings from Last 1 Encounters:  02/20/13 5\' 9"  (1.753 m)    Weight: Wt Readings from Last 1 Encounters:  02/20/13 134 lb (60.782 kg)    Ideal Body Weight: 160 lb  % Ideal Body Weight: 84%  Wt Readings from Last 10 Encounters:  02/20/13 134 lb (60.782 kg)  01/15/13 142 lb 5 oz (64.553 kg)    Usual Body Weight: 135 - 145 lb  % Usual Body Weight: 96%  BMI:  Body mass index is 19.78 kg/(m^2).  Normal  weight.  Estimated Nutritional Needs: Kcal: 1700 - 2000 Protein: 80 - 100 g Fluid: 1.7 - 2 liters  Skin: intact  Diet Order: General  EDUCATION NEEDS: -Education needs addressed   Intake/Output Summary (Last 24 hours) at 02/21/13 1326 Last data filed at 02/21/13 1000  Gross per 24 hour  Intake 3355.83 ml  Output   2940 ml  Net 415.83 ml    Last BM: 12/1  Labs:   Recent Labs Lab 02/20/13 1626 02/20/13 1905 02/21/13 1040  NA 129* 127* 135  K 3.3* 3.3* 4.2  CL 98 98 107  CO2 21 22 21   BUN 10 9 7   CREATININE 0.89 0.86 0.61  CALCIUM 7.2* 7.2* 7.7*  MG  --   --  2.1  GLUCOSE 96 107* 111*    CBG (last 3)  No results found for this basename: GLUCAP,  in the last 72 hours  Scheduled Meds: . heparin subcutaneous  5,000 Units Subcutaneous Q8H  . hydrocortisone sod succinate (SOLU-CORTEF) inj  50 mg Intravenous Q6H  . metronidazole  500 mg Intravenous Q8H  . piperacillin-tazobactam (ZOSYN)  IV  3.375 g Intravenous Q8H  . sodium chloride  3 mL Intravenous Q12H  . vancomycin  1,000 mg Intravenous Q8H    Continuous Infusions: . 0.9 % NaCl with KCl 40 mEq / L 250 mL/hr at 02/21/13 1130    Past Medical History  Diagnosis Date  . HIV disease     Past Surgical History  Procedure Laterality Date  . Colon surgery      Jarold Motto MS,  RD, LDN Pager: [phone] After-hours pager: [phone]

## 2013-02-21 NOTE — Progress Notes (Signed)
FMTS Attending Daily Note:  Renold Don MD  [phone] pager  Family Practice pager:  [phone] I have discussed this patient with the resident Dr. Birdie Sons and attending physician Dr. Randolm Idol.  I agree with their findings, assessment, and care plan

## 2013-02-21 NOTE — H&P (Signed)
FMTS Attending Note  I personally saw and evaluated the patient. The plan of care was discussed with the resident team. I agree with the assessment and plan as documented by the resident.   22 y/o male with PMH HIV, Syphilis that has been previously treated, history of colon cancer presents with abdominal pina and dysuria. Diagnosed to UTI and possible Pyelonephritis in ED. Please see resident HPI. Patient had fevers overnight. Currently has right upper quadrant abdominal pain, no nausea, no emesis, suprapubic pain present  Vitals: reviewed, febrile overnight Gen: pleasant AAM, NAD, laying in bed HEENT: pupils equal in size, no icterus, MMM, neck supple Cardiac: RRR, S1 and S2 present, no murmurs, no heaves/thrills Abd: soft, RUQ tenderness, + Murpheys sign, mild suprapubic tenderness, normal bowel sounds, no CVA tenderness Ext: no edema, 2+ radial pulses Skin: no rash  CT abdomen and pelvis: Radiologist interpretation pending, ? Thickened gallbladder with small fluid collection at superior aspect, multiple small fluid collections within the right kidney suspicious for abscess  1. UTI/Suspected Pyelonephritis/Sepsis - antibiotics have been broadened to Vanco/Zosyn, awaiting CT interpretation, If evidence of perinephric abscess would consider IR consultation for possible drainage 2. HIV positive - ID consulted, CD4 and viral load pending 3. History of Syphilis: Repeat RPR positive with titer of 1:2 which is suggestive of treated infection 4. HCV ab positive - awaiting viral count and genotype 5. RUQ abdominal pain: ?acute cholecystitis, await CT interpretation, if evidence of acute cholecystitis on CT would get surgery consultation 6. Hyponatremia - unclear etiology, differential includes SIADH vs adrenal insufficiency, cortisol level pending, received stress dose of steroids due to hypotension ans sepsis 7. History of colon cancer - patient needs to follow up with GI as outpatient  Donnella Sham MD

## 2013-02-21 NOTE — Progress Notes (Signed)
Pt temp was 104/ MD's notified with new orders received. Pt very anxious and with a poor understanding of his disease. Pt received 1000cc NS bolus with subsequent drop in temperature. Final temp 97.4 this morning. Blood pressure remains low at 90/50 manually obtained. Pt without dizziness when up. He refused his a.m. Heparin because he claims it makes him "feel weird".

## 2013-02-22 ENCOUNTER — Ambulatory Visit: Payer: Self-pay

## 2013-02-22 DIAGNOSIS — R7881 Bacteremia: Secondary | ICD-10-CM

## 2013-02-22 DIAGNOSIS — B20 Human immunodeficiency virus [HIV] disease: Secondary | ICD-10-CM

## 2013-02-22 DIAGNOSIS — E43 Unspecified severe protein-calorie malnutrition: Secondary | ICD-10-CM | POA: Diagnosis present

## 2013-02-22 DIAGNOSIS — K625 Hemorrhage of anus and rectum: Secondary | ICD-10-CM

## 2013-02-22 DIAGNOSIS — A630 Anogenital (venereal) warts: Secondary | ICD-10-CM

## 2013-02-22 DIAGNOSIS — N151 Renal and perinephric abscess: Secondary | ICD-10-CM | POA: Diagnosis present

## 2013-02-22 DIAGNOSIS — A498 Other bacterial infections of unspecified site: Secondary | ICD-10-CM

## 2013-02-22 LAB — HIV-1 RNA ULTRAQUANT REFLEX TO GENTYP+: HIV-1 RNA Quant, Log: 4.55 {Log} — ABNORMAL HIGH (ref <1.30)

## 2013-02-22 LAB — COMPREHENSIVE METABOLIC PANEL
ALT: 10 U/L (ref 0–53)
Albumin: 1.5 g/dL — ABNORMAL LOW (ref 3.5–5.2)
Alkaline Phosphatase: 45 U/L (ref 39–117)
BUN: 10 mg/dL (ref 6–23)
Calcium: 7.4 mg/dL — ABNORMAL LOW (ref 8.4–10.5)
Chloride: 108 mEq/L (ref 96–112)
Creatinine, Ser: 0.64 mg/dL (ref 0.50–1.35)
GFR calc non Af Amer: >90 mL/min (ref >90)
Glucose, Bld: 104 mg/dL — ABNORMAL HIGH (ref 70–99)
Potassium: 3.1 mEq/L — ABNORMAL LOW (ref 3.5–5.1)
Sodium: 136 mEq/L (ref 135–145)
Total Bilirubin: 0.1 mg/dL — ABNORMAL LOW (ref 0.3–1.2)
Total Protein: 6.4 g/dL (ref 6.0–8.3)

## 2013-02-22 LAB — CLOSTRIDIUM DIFFICILE BY PCR: Toxigenic C. Difficile by PCR: NEGATIVE

## 2013-02-22 LAB — CBC
HCT: 23.9 % — ABNORMAL LOW (ref 39.0–52.0)
Hemoglobin: 8.3 g/dL — ABNORMAL LOW (ref 13.0–17.0)
MCH: 30.9 pg (ref 26.0–34.0)
MCHC: 34.7 g/dL (ref 30.0–36.0)
RDW: 16.3 % — ABNORMAL HIGH (ref 11.5–15.5)
WBC: 6.4 10*3/uL (ref 4.0–10.5)

## 2013-02-22 LAB — GC/CHLAMYDIA PROBE AMP: CT Probe RNA: NEGATIVE

## 2013-02-22 LAB — URINE CULTURE: Colony Count: >=100000

## 2013-02-22 LAB — T-HELPER CELLS (CD4) COUNT (NOT AT ARMC): CD4 % Helper T Cell: 10 % — ABNORMAL LOW (ref 33–55)

## 2013-02-22 MED ORDER — BOOST / RESOURCE BREEZE PO LIQD
1.0000 | Freq: Three times a day (TID) | ORAL | Status: DC
  Administered 2013-02-22 – 2013-02-24 (×5): 1 via ORAL

## 2013-02-22 MED ORDER — POTASSIUM CHLORIDE CRYS ER 20 MEQ PO TBCR
20.0000 meq | EXTENDED_RELEASE_TABLET | Freq: Three times a day (TID) | ORAL | Status: AC
  Administered 2013-02-22 (×3): 20 meq via ORAL
  Filled 2013-02-22 (×3): qty 1

## 2013-02-22 MED ORDER — SULFAMETHOXAZOLE-TMP DS 800-160 MG PO TABS
1.0000 | ORAL_TABLET | Freq: Every day | ORAL | Status: DC
  Administered 2013-02-22 – 2013-02-25 (×4): 1 via ORAL
  Filled 2013-02-22 (×4): qty 1

## 2013-02-22 MED ORDER — CEPHALEXIN 500 MG PO CAPS
500.0000 mg | ORAL_CAPSULE | Freq: Four times a day (QID) | ORAL | Status: DC
  Administered 2013-02-22 – 2013-02-23 (×4): 500 mg via ORAL
  Filled 2013-02-22 (×9): qty 1

## 2013-02-22 NOTE — Progress Notes (Signed)
FMTS Attending Daily Note:  Jay Don MD  [phone] pager  Family Practice pager:  [phone] I have seen and examined this patient and have reviewed their chart. I have discussed this patient with the resident. I agree with the resident's findings, assessment and care plan.  Additionally:  - Low CD4 today.  AGree with Bactrim prophylaxis.  Otherwise improving well.  Greatly appreciate sub-specialty input.    Tobey Grim, MD 02/22/2013 3:03 PM

## 2013-02-22 NOTE — Progress Notes (Addendum)
INFECTIOUS DISEASE PROGRESS NOTE  ID: Jay Simon is a 22 y.o. 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 Discussed his CD4#  Abtx:  Anti-infectives   Start     Dose/Rate Route Frequency Ordered Stop   02/22/13 1600  sulfamethoxazole-trimethoprim (BACTRIM DS) 800-160 MG per tablet 1 tablet     1 tablet Oral Daily 02/22/13 1234     02/22/13 1245  cephALEXin (KEFLEX) capsule 500 mg     500 mg Oral 4 times per day 02/22/13 1234     02/21/13 1700  cefTRIAXone (ROCEPHIN) 2 g in dextrose 5 % 50 mL IVPB  Status:  Discontinued     2 g 100 mL/hr over 30 Minutes Intravenous Every 24 hours 02/21/13 1547 02/22/13 1234   02/20/13 2300  metroNIDAZOLE (FLAGYL) IVPB 500 mg  Status:  Discontinued     500 mg 100 mL/hr over 60 Minutes Intravenous Every 8 hours 02/20/13 2245 02/21/13 2035   02/20/13 2230  piperacillin-tazobactam (ZOSYN) IVPB 3.375 g  Status:  Discontinued     3.375 g 12.5 mL/hr over 240 Minutes Intravenous 3 times per day 02/20/13 2218 02/21/13 1546   02/20/13 2230  vancomycin (VANCOCIN) IVPB 1000 mg/200 mL premix  Status:  Discontinued     1,000 mg 200 mL/hr over 60 Minutes Intravenous Every 8 hours 02/20/13 2218 02/21/13 1546   02/20/13 1800  cefoTAXime (CLAFORAN) 1 g in dextrose 5 % 50 mL IVPB  Status:  Discontinued     1 g 100 mL/hr over 30 Minutes Intravenous 4 times per day 02/20/13 1547 02/20/13 2200   02/20/13 1245  cefTRIAXone (ROCEPHIN) 2 g in dextrose 5 % 50 mL IVPB     2 g 100 mL/hr over 30 Minutes Intravenous  Once 02/20/13 1240 02/20/13 1520      Medications:  Scheduled: . cephALEXin  500 mg Oral Q6H  . feeding supplement (RESOURCE BREEZE)  1 Container Oral TID BM  . heparin subcutaneous  5,000 Units Subcutaneous Q8H  . influenza vac split  quadrivalent PF  0.5 mL Intramuscular Tomorrow-1000  . pneumococcal 23 valent vaccine  0.5 mL Intramuscular Tomorrow-1000  . potassium chloride  20 mEq Oral TID  . sodium chloride  3 mL Intravenous Q12H  . sulfamethoxazole-trimethoprim  1 tablet Oral Daily  . tuberculin  5 Units Intradermal Once    Objective: Vital signs in last 24 hours: Temp:  [97.3 F (36.3 C)-98.3 F (36.8 C)] 97.3 F (36.3 C) (12/02 1400) Pulse Rate:  [69-105] 69 (12/02 1400) Resp:  [16-20] 20 (12/02 1400) BP: (90-110)/(54-72) 100/54 mmHg (12/02 1400) SpO2:  [99 %-100 %] 100 % (12/02 1400)   General appearance: alert, cooperative and no distress Resp: clear to auscultation bilaterally Cardio: regular rate and rhythm GI: normal findings: bowel sounds normal and soft, non-tender  Lab Results  Recent Labs  02/21/13 1040 02/22/13 0650  WBC 6.7 6.4  HGB 9.1* 8.3*  HCT 26.6* 23.9*  NA 135 136  K 4.2 3.1*  CL 107 108  CO2 21 22  BUN 7 10  CREATININE 0.61 0.64   Liver Panel  Recent Labs  02/21/13 1040 02/22/13 0650  PROT 7.6 6.4  ALBUMIN 1.7* 1.5*  AST 24 16  ALT 12 10  ALKPHOS 47 45  BILITOT 0.4 0.1*   Sedimentation  Rate No results found for this basename: ESRSEDRATE,  in the last 72 hours C-Reactive Protein No results found for this basename: CRP,  in the last 72 hours  Microbiology: Recent Results (from the past 240 hour(s))  CULTURE, BLOOD (ROUTINE X 2)     Status: None   Collection Time    02/20/13 10:00 AM      Result Value Range Status   Specimen Description BLOOD LEFT ARM   Final   Special Requests BOTTLES DRAWN AEROBIC AND ANAEROBIC B10CC [facility]   Final   Culture  Setup Time     Final   Value: 02/20/2013 12:54     Performed at [facility]   Culture     Final   Value: GRAM NEGATIVE RODS     Note: Gram Stain Report Called to,Read Back By and Verified With: Jerl Mina RN on 02/21/13 at 00:35 by Christie Nottingham     Performed at [facility]   Report Status PENDING    Incomplete  CULTURE, BLOOD (ROUTINE X 2)     Status: None   Collection Time    02/20/13 10:10 AM      Result Value Range Status   Specimen Description BLOOD LEFT HAND   Final   Special Requests BOTTLES DRAWN AEROBIC ONLY 10CC   Final   Culture  Setup Time     Final   Value: 02/20/2013 12:54     Performed at [facility]   Culture     Final   Value:        BLOOD CULTURE RECEIVED NO GROWTH TO DATE CULTURE WILL BE HELD FOR 5 DAYS BEFORE ISSUING A FINAL NEGATIVE REPORT     Performed at [facility]   Report Status PENDING   Incomplete  URINE CULTURE     Status: None   Collection Time    02/20/13 11:29 AM      Result Value Range Status   Specimen Description URINE, RANDOM   Final   Special Requests NONE   Final   Culture  Setup Time     Final   Value: 02/20/2013 17:04     Performed at [facility] Count     Final   Value: >=100,000 COLONIES/ML     Performed at [facility]   Culture     Final   Value: ESCHERICHIA COLI     Performed at [facility]   Report Status 02/22/2013 FINAL   Final   Organism ID, Bacteria ESCHERICHIA COLI   Final  GC/CHLAMYDIA PROBE AMP     Status: None   Collection Time    02/21/13  7:18 PM      Result Value Range Status   CT Probe RNA NEGATIVE  NEGATIVE Final   GC Probe RNA NEGATIVE  NEGATIVE Final   Comment: (NOTE)                                                                                               **Normal Reference Range: Negative**  Assay performed using the Gen-Probe APTIMA COMBO2 (R) Assay.     Acceptable specimen types for this assay include APTIMA Swabs (Unisex,     endocervical, urethral, or vaginal), first void urine, and ThinPrep     liquid based cytology samples.     Performed at [facility] DIFFICILE BY PCR     Status: None   Collection Time    02/22/13 10:20 AM      Result Value Range Status   C difficile by pcr NEGATIVE  NEGATIVE Final     Studies/Results: Ct Abdomen Pelvis W Contrast  02/21/2013   CLINICAL DATA:  22 year old with positive Murphy's sign and suprapubic tenderness.  EXAM: CT ABDOMEN AND PELVIS WITH CONTRAST  TECHNIQUE: Multidetector CT imaging of the abdomen and pelvis was performed using the standard protocol following bolus administration of intravenous contrast.  CONTRAST:  OMNIPAQUE IOHEXOL 300 MG/ML  SOLN  COMPARISON:  01/15/2013  FINDINGS: Mild haziness at the left lung base is suggestive for mild atelectasis. There is no evidence for free intraperitoneal air.  There is at least mild gallbladder wall thickening. Difficult to evaluate for gallstones on CT. There is mild periportal edema which is new or more conspicuous compared to the previous examination. Again noted is a prominent spleen measuring 13.7 x 13.9 x 6.9 cm. Estimated splenic volume is 657 ml. No gross abnormality to the pancreas or adrenal tissue. The liver is large measuring 22.2 cm in the craniocaudal dimension. The left kidney has a normal appearance.  The right kidney has a very heterogeneous perfusion pattern and findings are concerning for pyelonephritis. In addition, there is a new 1.8 cm low-density structure along the posterior right kidney upper pole. There are two adjacent low density structures along the medial aspect of the left kidney measuring 1.5 and 0.8 cm. These low density structures have rapidly developed since 01/15/2013 and highly concerning for an inflammatory process and abscess collections. The right kidney has enlarged compared to the prior examination, measuring 13.7 cm in length and previously measuring 11.6 cm.  No definite free fluid or lymphadenopathy. Prominent vessels in the pelvis are poorly characterized on this examination. Small amount of fluid in the urinary bladder. There appears to be gas and contrast within the appendix but this area is difficult to visualize due to the lack of intra-abdominal fat.  Small amount  of gas within the right anterior subcutaneous tissues are probably related to an injection site. Small bilateral inguinal lymph nodes are stable from the previous examination.  No acute bone abnormality.  IMPRESSION: Enlargement and heterogeneous appearance of the right kidney with new small low-density structures in the upper pole. Findings are most compatible with acute pyelonephritis and probable small abscesses.  Mild wall thickening in the gallbladder with periportal edema. Consider further evaluation with an ultrasound to evaluate for gallstones.  Hepatomegaly and splenomegaly.  These results were called by telephone at the time of interpretation on 02/21/2013 at 8:16 AM to Dr. Anselm Lis, who verbally acknowledged these results.   Electronically Signed   By: Richarda Overlie M.D.   On: 02/21/2013 08:17     Assessment/Plan: AIDS UTI Sepsis Anal Condyloma (by pt hx)  Rectal Beleeding  Total days of antibiotics: 3 (keflex), bactrim  Would Continue pt on IV ceftriaxone Pt has BCx+ and was septic on admission, I would be hesitant to treat him with PO anbx at this point despite his improvement.  Would aim for at least 7 days of IV  prior to changing him to PO (at which point would consider putting him on keflex, cipro or levaquin).   Look forward to seeing pt in ID clinic to get him onto HIV therapy, getting him onto AIDS Drug Assistance Program (ADAP) and getting him enrolled in case mgmt as needed. Also, will get him set up for anoscopy in ID clinic.  PPD placed on R forearm.    HIV 1 RNA Quant (copies/mL)  Date Value  02/21/2013 35536*  02/20/2013 115000*     CD4 T Cell Abs Marland Kitchen/uL)  Date Value  02/21/2013 100*            [name] Infectious Diseases (pager) [phone] [email or web address] 02/22/2013, 6:07 PM  LOS: 2 days

## 2013-02-22 NOTE — Progress Notes (Signed)
PULMONARY  / CRITICAL CARE MEDICINE  Name: Jay Simon MRN: [id number] DOB: 09-10-1990    ADMISSION DATE:  02/20/2013 CONSULTATION DATE:  12-1  REFERRING MD :  TS PRIMARY SERVICE: TS  CHIEF COMPLAINT:  Hypotension  BRIEF PATIENT DESCRIPTION:  22 yo male who with hx of Syphilis (treated) and HIV with CD4 count>300 who presents with recurrent flank pain(treated 10-25 [place] with keflex) sweats and hypotension. He has pmh significant for cancerous colon polyps dx in 2012.  He has remained hypotensive(90/50 despite IVF (but is a net negative I/O). PCCM asked to evaluate. SIGNIFICANT EVENTS / STUDIES:    LINES / TUBES:   CULTURES: 11-30 uc>>e coli ss roc 11-30 BC>>    ANTIBIOTICS: 11-30 flagyl>>12-1 11-20 zoysn>>12-1 11-30 vanco>>12-1 12-1 roc per ID>>  HISTORY OF PRESENT ILLNESS:   22 yo male who with hx of Syphilis (treated) and HIV with CD4 count>300 who presents with recurrent flank pain(treated 10-25 [place] with keflex) sweats and hypotension. He has pmh significant for cancerous colon polyps dx in 2012.  He has remained hypotensive(90/50 despite IVF (but is a net negative I/O). PCCM asked to evaluate.  SUBJECTIVE:  NAD  VITAL SIGNS: Temp:  [97.4 F (36.3 C)-98.3 F (36.8 C)] 98.3 F (36.8 C) (12/02 0600) Pulse Rate:  [72-105] 80 (12/02 0600) Resp:  [16-20] 16 (12/02 0600) BP: (90-110)/(62-72) 90/62 mmHg (12/02 0600) SpO2:  [99 %-100 %] 100 % (12/02 0600) HEMODYNAMICS:   VENTILATOR SETTINGS:   INTAKE / OUTPUT: Intake/Output     12/01 0701 - 12/02 0700 12/02 0701 - 12/03 0700   P.O. 1200    I.V. (mL/kg) 3851.7 (63.4)    IV Piggyback 2100    Total Intake(mL/kg) 7151.7 (117.7)    Urine (mL/kg/hr) 1475 (1)    Emesis/NG output     Stool 1 (0)    Total Output 1476     Net +5675.7            PHYSICAL EXAMINATION: General:  Thin aam , awake and interactive, No distress. Neuro: Intact, MAE x 4, follows all commands, sitting up eating. HEENT:  No JVD/LAN,  oral pharynx dry. Cardiovascular:  HSR RRR ST Lungs:  Decreased bs bases Abdomen:  Soft non tender Musculoskeletal:  intact Skin:  Warm and dry  LABS:  CBC  Recent Labs Lab 02/20/13 0940 02/21/13 1040 02/22/13 0650  WBC 9.5 6.7 6.4  HGB 10.4* 9.1* 8.3*  HCT 30.3* 26.6* 23.9*  PLT 177 142* 168   Coag's No results found for this basename: APTT, INR,  in the last 168 hours BMET  Recent Labs Lab 02/20/13 1905 02/21/13 1040 02/22/13 0650  NA 127* 135 136  K 3.3* 4.2 3.1*  CL 98 107 108  CO2 22 21 22   BUN 9 7 10   CREATININE 0.86 0.61 0.64  GLUCOSE 107* 111* 104*   Electrolytes  Recent Labs Lab 02/20/13 1905 02/21/13 1040 02/22/13 0650  CALCIUM 7.2* 7.7* 7.4*  MG  --  2.1  --    Sepsis Markers  Recent Labs Lab 02/20/13 0941 02/20/13 2316 02/21/13 1040  LATICACIDVEN 1.4 0.6  --   PROCALCITON  --   --  8.07   ABG No results found for this basename: PHART, PCO2ART, PO2ART,  in the last 168 hours Liver Enzymes  Recent Labs Lab 02/20/13 0940 02/21/13 1040 02/22/13 0650  AST 24 24 16   ALT 13 12 10   ALKPHOS 49 47 45  BILITOT 0.4 0.4 0.1*  ALBUMIN 2.4* 1.7*  1.5*   Cardiac Enzymes No results found for this basename: TROPONINI, PROBNP,  in the last 168 hours Glucose No results found for this basename: GLUCAP,  in the last 168 hours  Imaging Dg Chest 2 View  02/20/2013   CLINICAL DATA:  Chest pain with fever, nausea/vomiting  EXAM: CHEST  2 VIEW  COMPARISON:  None.  FINDINGS: Lungs are clear. No pleural effusion or pneumothorax.  The heart is normal in size.  Visualized osseous structures are within normal limits.  IMPRESSION: Normal chest radiographs.   Electronically Signed   By: Charline Bills M.D.   On: 02/20/2013 10:26   Ct Abdomen Pelvis W Contrast  02/21/2013   CLINICAL DATA:  22 year old with positive Murphy's sign and suprapubic tenderness.  EXAM: CT ABDOMEN AND PELVIS WITH CONTRAST  TECHNIQUE: Multidetector CT imaging of the abdomen and  pelvis was performed using the standard protocol following bolus administration of intravenous contrast.  CONTRAST:  OMNIPAQUE IOHEXOL 300 MG/ML  SOLN  COMPARISON:  01/15/2013  FINDINGS: Mild haziness at the left lung base is suggestive for mild atelectasis. There is no evidence for free intraperitoneal air.  There is at least mild gallbladder wall thickening. Difficult to evaluate for gallstones on CT. There is mild periportal edema which is new or more conspicuous compared to the previous examination. Again noted is a prominent spleen measuring 13.7 x 13.9 x 6.9 cm. Estimated splenic volume is 657 ml. No gross abnormality to the pancreas or adrenal tissue. The liver is large measuring 22.2 cm in the craniocaudal dimension. The left kidney has a normal appearance.  The right kidney has a very heterogeneous perfusion pattern and findings are concerning for pyelonephritis. In addition, there is a new 1.8 cm low-density structure along the posterior right kidney upper pole. There are two adjacent low density structures along the medial aspect of the left kidney measuring 1.5 and 0.8 cm. These low density structures have rapidly developed since 01/15/2013 and highly concerning for an inflammatory process and abscess collections. The right kidney has enlarged compared to the prior examination, measuring 13.7 cm in length and previously measuring 11.6 cm.  No definite free fluid or lymphadenopathy. Prominent vessels in the pelvis are poorly characterized on this examination. Small amount of fluid in the urinary bladder. There appears to be gas and contrast within the appendix but this area is difficult to visualize due to the lack of intra-abdominal fat.  Small amount of gas within the right anterior subcutaneous tissues are probably related to an injection site. Small bilateral inguinal lymph nodes are stable from the previous examination.  No acute bone abnormality.  IMPRESSION: Enlargement and heterogeneous  appearance of the right kidney with new small low-density structures in the upper pole. Findings are most compatible with acute pyelonephritis and probable small abscesses.  Mild wall thickening in the gallbladder with periportal edema. Consider further evaluation with an ultrasound to evaluate for gallstones.  Hepatomegaly and splenomegaly.  These results were called by telephone at the time of interpretation on 02/21/2013 at 8:16 AM to Dr. Anselm Lis, who verbally acknowledged these results.   Electronically Signed   By: Richarda Overlie M.D.   On: 02/21/2013 08:17     CXR: see above  ASSESSMENT / PLAN:  PULMONARY A:No acute issue P:   - Titrate O2 as needed.  CARDIOVASCULAR A: Shock from presumed sepsis from Urinary infection source. Resolved 12-2 P:  - Agree with abx. - Fluid resuscitation.  RENAL Lab Results  Component Value  Date   CREATININE 0.64 02/22/2013   CREATININE 0.61 02/21/2013   CREATININE 0.86 02/20/2013   A:  No acute issue P:   - F/U BMET.  GASTROINTESTINAL A:  Hx of colon polyps  P:   - GI protection  HEMATOLOGIC A:  No acute issue P:  - Monitor CBC.  INFECTIOUS A:  Presumed from urinary source in HIV patient. P:   - See flows - ID consults for HIV  ENDOCRINE A:  No acute issue  P:   - Check cortisol level.  NEUROLOGIC A:  No acute process P:   - Monitor.  TODAY'S SUMMARY:  Hypotension resolved with treatment. PCCM will sign off.  Brett Canales Minor ACNP [facility] PCCM Pager [phone] till 3 pm If no answer page [phone] 02/22/2013, 10:00 AM  Agree with above  PCCM will sign off. Please call if we can be of further assistance  Billy Fischer, MD ; [facility] [phone].  After 5:30 PM or weekends, call [phone]

## 2013-02-22 NOTE — Progress Notes (Signed)
Family Medicine Teaching Service Daily Progress Note Intern Pager: [phone]  Patient name: Jay Simon Medical record number: [id number] Date of birth: 1990/06/13 Age: 22 y.o. Gender: male  Primary Care Provider: No PCP Per Patient Consultants: ID, GI, CCM Code Status: Full Code  Pt Overview and Major Events to Date:  11/30 Admitted with pyelonephritis/sepsis 12/1 transitioned from Vanc/Zosyn to CTX 12/2 started on PO keflex for E. Coli and Bactrim for PCP, Toxo ppx   Assessment and Plan: [name] is a 22yo male with a past medical history of recently diagnosed HIV, who presented with fevers, night sweats, and flank pain.   UTI/Pyelonephritis/Sepsis: Urine and blood cultures grew gram negative rods. Pt was afebrile overnight, but had one episode of tachycardia to 105.  His BPs have been appropriate since 12/1 at 9am.  2 small renal abcesses were noted on CT scan.    - s/p Vanc 100mg  q8 IV, Zosyn 3.375g IV q8, and Flagyl 500mg  IV q8 x 1 day and CTX x 1 day; switch to PO keflex given known sensitivities - appreciate CCM and ID eval of pt  HIV positive: Pt untreated for HIV, untreated - appreciate ID recommendations - f/u viral load - CD4 100, bactrim needed as prophylaxis - urine GC/Chlamydia negative - start Hep A and Hep B vaccines - Give influenza and pneumonia vaccines - C.Diff negative  Hyponatremia: Pt arrived with hyponatremia with unclear etiology; he was given NS. 124 at admission, 136 on 12/2  - KVO - continue to monitor   Anemia: Pt Hgb trending downward 12.5 -> 10.4 -> 9.1. -> 8.3 Pt endorses BRBPR secondary to constipation - GI consult appreciated  - continue to monitor Hgb trend closely   Syphilis Reactive: Titer 1:2 indicating treated infection   Hep C reactive:  - f/u viral count and genotype  - LFTs stable  Protein Malnutrition - Albumin 1.5 - may also be due to proteinuria, so need to repeat u/a when infection clear - give resource breeze nutrition  supplements TID   FEN/GI: Full diet  - replete K  PPx: heparin  Disposition: pt Hgb continues to drop.  Discharge pending continued improvement  Subjective: Pt complains of back pain on the right flank, tender with palpation this morning.  Otherwise, he is feeling much better.  He is eating well.  He had 1 episode of diarrhea and 1 soft bowel movement since admission.  He did not note any blood.    Objective: Temp:  [97.4 F (36.3 C)-98.3 F (36.8 C)] 98.3 F (36.8 C) (12/02 0600) Pulse Rate:  [72-105] 80 (12/02 0600) Resp:  [16-20] 16 (12/02 0600) BP: (90-110)/(62-72) 90/62 mmHg (12/02 0600) SpO2:  [99 %-100 %] 100 % (12/02 0600) Physical Exam: General: no acute distress, lying in bed  Cardiovascular: regular rate and rhythm, no murmurs Respiratory: clear to auscultation bilaterally Abdomen: nondistended, nontender, normal bowel sounds Back: tenderness on right side at costcophrenic angle Extremities: no edema peripherally  Laboratory:  Recent Labs Lab 02/20/13 0940 02/21/13 1040 02/22/13 0650  WBC 9.5 6.7 6.4  HGB 10.4* 9.1* 8.3*  HCT 30.3* 26.6* 23.9*  PLT 177 142* 168    Recent Labs Lab 02/20/13 0940  02/20/13 1905 02/21/13 1040 02/22/13 0650  NA 124*  < > 127* 135 136  K 3.4*  < > 3.3* 4.2 3.1*  CL 89*  < > 98 107 108  CO2 24  < > 22 21 22   BUN 13  < > 9 7 10   CREATININE  1.16  < > 0.86 0.61 0.64  CALCIUM 8.3*  < > 7.2* 7.7* 7.4*  PROT 9.7*  --   --  7.6 6.4  BILITOT 0.4  --   --  0.4 0.1*  ALKPHOS 49  --   --  47 45  ALT 13  --   --  12 10  AST 24  --   --  24 16  GLUCOSE 101*  < > 107* 111* 104*  < > = values in this interval not displayed.  Si Raider Clinton Sawyer, MD, MBA 02/22/2013, 2:42 PM Family Medicine Resident, PGY-3 [phone] pager

## 2013-02-22 NOTE — Progress Notes (Signed)
Subjective: No acute events.  Feeling well.  Objective: Vital signs in last 24 hours: Temp:  [97.3 F (36.3 C)-98.3 F (36.8 C)] 97.3 F (36.3 C) (12/02 1400) Pulse Rate:  [69-105] 69 (12/02 1400) Resp:  [16-20] 20 (12/02 1400) BP: (90-110)/(54-72) 100/54 mmHg (12/02 1400) SpO2:  [99 %-100 %] 100 % (12/02 1400) Last BM Date: 02/21/13  Intake/Output from previous day: 12/01 0701 - 12/02 0700 In: 7151.7 [P.O.:1200; I.V.:3851.7; IV Piggyback:2100] Out: 1476 [Urine:1475; Stool:1] Intake/Output this shift: Total I/O In: 480 [P.O.:480] Out: -   General appearance: alert and no distress GI: soft, non-tender; bowel sounds normal; no masses,  no organomegaly  Lab Results:  Recent Labs  02/20/13 0940 02/21/13 1040 02/22/13 0650  WBC 9.5 6.7 6.4  HGB 10.4* 9.1* 8.3*  HCT 30.3* 26.6* 23.9*  PLT 177 142* 168   BMET  Recent Labs  02/20/13 1905 02/21/13 1040 02/22/13 0650  NA 127* 135 136  K 3.3* 4.2 3.1*  CL 98 107 108  CO2 22 21 22   GLUCOSE 107* 111* 104*  BUN 9 7 10   CREATININE 0.86 0.61 0.64  CALCIUM 7.2* 7.7* 7.4*   LFT  Recent Labs  02/22/13 0650  PROT 6.4  ALBUMIN 1.5*  AST 16  ALT 10  ALKPHOS 45  BILITOT 0.1*   PT/INR No results found for this basename: LABPROT, INR,  in the last 72 hours Hepatitis Panel  Recent Labs  02/20/13 1640  HEPBSAG NEGATIVE  HCVAB Reactive*   C-Diff No results found for this basename: CDIFFTOX,  in the last 72 hours Fecal Lactopherrin No results found for this basename: FECLLACTOFRN,  in the last 72 hours  Studies/Results: Ct Abdomen Pelvis W Contrast  02/21/2013   CLINICAL DATA:  22 year old with positive Murphy's sign and suprapubic tenderness.  EXAM: CT ABDOMEN AND PELVIS WITH CONTRAST  TECHNIQUE: Multidetector CT imaging of the abdomen and pelvis was performed using the standard protocol following bolus administration of intravenous contrast.  CONTRAST:  OMNIPAQUE IOHEXOL 300 MG/ML  SOLN  COMPARISON:   01/15/2013  FINDINGS: Mild haziness at the left lung base is suggestive for mild atelectasis. There is no evidence for free intraperitoneal air.  There is at least mild gallbladder wall thickening. Difficult to evaluate for gallstones on CT. There is mild periportal edema which is new or more conspicuous compared to the previous examination. Again noted is a prominent spleen measuring 13.7 x 13.9 x 6.9 cm. Estimated splenic volume is 657 ml. No gross abnormality to the pancreas or adrenal tissue. The liver is large measuring 22.2 cm in the craniocaudal dimension. The left kidney has a normal appearance.  The right kidney has a very heterogeneous perfusion pattern and findings are concerning for pyelonephritis. In addition, there is a new 1.8 cm low-density structure along the posterior right kidney upper pole. There are two adjacent low density structures along the medial aspect of the left kidney measuring 1.5 and 0.8 cm. These low density structures have rapidly developed since 01/15/2013 and highly concerning for an inflammatory process and abscess collections. The right kidney has enlarged compared to the prior examination, measuring 13.7 cm in length and previously measuring 11.6 cm.  No definite free fluid or lymphadenopathy. Prominent vessels in the pelvis are poorly characterized on this examination. Small amount of fluid in the urinary bladder. There appears to be gas and contrast within the appendix but this area is difficult to visualize due to the lack of intra-abdominal fat.  Small amount of gas  within the right anterior subcutaneous tissues are probably related to an injection site. Small bilateral inguinal lymph nodes are stable from the previous examination.  No acute bone abnormality.  IMPRESSION: Enlargement and heterogeneous appearance of the right kidney with new small low-density structures in the upper pole. Findings are most compatible with acute pyelonephritis and probable small abscesses.   Mild wall thickening in the gallbladder with periportal edema. Consider further evaluation with an ultrasound to evaluate for gallstones.  Hepatomegaly and splenomegaly.  These results were called by telephone at the time of interpretation on 02/21/2013 at 8:16 AM to Dr. Anselm Lis, who verbally acknowledged these results.   Electronically Signed   By: Richarda Overlie M.D.   On: 02/21/2013 08:17    Medications:  Scheduled: . cephALEXin  500 mg Oral Q6H  . feeding supplement (RESOURCE BREEZE)  1 Container Oral TID BM  . heparin subcutaneous  5,000 Units Subcutaneous Q8H  . influenza vac split quadrivalent PF  0.5 mL Intramuscular Tomorrow-1000  . pneumococcal 23 valent vaccine  0.5 mL Intramuscular Tomorrow-1000  . potassium chloride  20 mEq Oral TID  . sodium chloride  3 mL Intravenous Q12H  . sulfamethoxazole-trimethoprim  1 tablet Oral Daily  . tuberculin  5 Units Intradermal Once   Continuous: . sodium chloride 10 mL/hr (02/22/13 1227)    Assessment/Plan: 1) Hematochezia. 2) History of Anal condyloma. 3) HIV.  4) Pyelonephritis.   The patient declined a rectal examination.  I am not able to determine if he is having hematochezia from his hemorrhoids, anal condylomas, or any malignant lesion.  He reports that he does not like rectal examinations.  If this is the case, I doubt that the will undergo a colonoscopy, if required.  Plan: 1) No further GI evaluation at this time unless patient allows for an examination. 2) Signing off.  LOS: 2 days   Dedra Matsuo D 02/22/2013, 4:50 PM

## 2013-02-23 DIAGNOSIS — N151 Renal and perinephric abscess: Secondary | ICD-10-CM

## 2013-02-23 LAB — CULTURE, BLOOD (ROUTINE X 2)

## 2013-02-23 LAB — BASIC METABOLIC PANEL
Chloride: 109 mEq/L (ref 96–112)
GFR calc Af Amer: >90 mL/min (ref >90)
GFR calc non Af Amer: >90 mL/min (ref >90)
Potassium: 3.7 mEq/L (ref 3.5–5.1)
Sodium: 137 mEq/L (ref 135–145)

## 2013-02-23 LAB — CBC
MCH: 29.7 pg (ref 26.0–34.0)
MCHC: 32.9 g/dL (ref 30.0–36.0)
MCV: 90.3 fL (ref 78.0–100.0)
Platelets: 221 10*3/uL (ref 150–400)
RDW: 16.1 % — ABNORMAL HIGH (ref 11.5–15.5)
WBC: 4.9 10*3/uL (ref 4.0–10.5)

## 2013-02-23 MED ORDER — DEXTROSE 5 % IV SOLN
1.0000 g | INTRAVENOUS | Status: DC
  Administered 2013-02-23 – 2013-02-25 (×3): 1 g via INTRAVENOUS
  Filled 2013-02-23 (×4): qty 10

## 2013-02-23 NOTE — Progress Notes (Signed)
Family Medicine Teaching Service Daily Progress Note Intern Pager: [phone]  Patient name: Jay Simon Medical record number: [id number] Date of birth: 29-Oct-1990 Age: 22 y.o. Gender: male  Primary Care Provider: No PCP Per Patient Consultants: ID, GI, CCM Code Status: Full Code  Pt Overview and Major Events to Date:  11/30 Admitted with pyelonephritis/sepsis 12/1 transitioned from Vanc/Zosyn to CTX 12/2 started on PO keflex for E. Coli and Bactrim for PCP, Toxo ppx  12/3 transition back to IV ceftriaxone  Assessment and Plan: Jay Simon is a 22yo male with a past medical history of recently diagnosed HIV, who presented with fevers, night sweats, and flank pain.   UTI/Pyelonephritis/Sepsis: Urine and blood cultures grew gram negative rods.  Pt vital signs stable past 24hrs.  2 small renal abcesses were noted on CT scan.    - s/p Vanc 100mg  q8 IV, Zosyn 3.375g IV q8, and Flagyl 500mg  IV q8 x 1 day and CTX x 1 day; switched to PO keflex x1day - restart Ceftriaxone IV x7 days per ID recommendations, then switch to PO - appreciate CCM and ID eval of pt and recommendations  HIV positive: Pt untreated for HIV, untreated - appreciate ID recommendations, plan for f/u in clinic - viral load 35,536 - CD4 100, bactrim needed as prophylaxis - urine GC/Chlamydia negative - start Hep A and Hep B vaccines - Give influenza and pneumonia vaccines - C.Diff negative  Hyponatremia: Resolved. Pt arrived with hyponatremia with unclear etiology; he was given NS. 124 at admission. - KVO - continue to monitor   Anemia: Hgb stabalized today at 8.3, 12.5 at admission. Pt endorses BRBPR secondary to constipation - GI consult appreciated  - continue to monitor Hgb trend closely   Syphilis Reactive: Titer 1:2 indicating treated infection   Hep C reactive:  - f/u viral count and genotype  - LFTs stable  Protein Malnutrition - Albumin 1.5 - may also be due to proteinuria, so need to repeat u/a  when infection clear - give resource breeze nutrition supplements TID   FEN/GI: Full diet   PPx: heparin  Disposition: Pt requires 7 days IV anbx per ID recommendations, discharge pending ID recommendations  Subjective: Pt doing well this morning.  Eating and drinking well.  Continues to endorse small, pressure-like pain close to right costophrenic angle.  BM today, normal, no straining.  Objective: Temp:  [97.3 F (36.3 C)-99.1 F (37.3 C)] 99.1 F (37.3 C) (12/03 0524) Pulse Rate:  [69-94] 82 (12/03 0925) Resp:  [18-20] 18 (12/03 0925) BP: (97-106)/(54-69) 106/69 mmHg (12/03 0925) SpO2:  [100 %] 100 % (12/03 0925) Weight:  [145 lb 14.4 oz (66.18 kg)] 145 lb 14.4 oz (66.18 kg) (12/02 2142) Physical Exam: General: no acute distress, lying in bed  Cardiovascular: regular rate and rhythm, no murmurs Respiratory: clear to auscultation bilaterally Abdomen: nondistended, nontender, normal bowel sounds Back: tenderness on right side at costcophrenic angle Extremities: no edema peripherally  Laboratory:  Recent Labs Lab 02/21/13 1040 02/22/13 0650 02/23/13 0558  WBC 6.7 6.4 4.9  HGB 9.1* 8.3* 8.3*  HCT 26.6* 23.9* 25.2*  PLT 142* 168 221    Recent Labs Lab 02/20/13 0940  02/21/13 1040 02/22/13 0650 02/23/13 0558  NA 124*  < > 135 136 137  K 3.4*  < > 4.2 3.1* 3.7  CL 89*  < > 107 108 109  CO2 24  < > 21 22 22   BUN 13  < > 7 10 7   CREATININE 1.16  < >  0.61 0.64 0.75  CALCIUM 8.3*  < > 7.7* 7.4* 7.6*  PROT 9.7*  --  7.6 6.4  --   BILITOT 0.4  --  0.4 0.1*  --   ALKPHOS 49  --  47 45  --   ALT 13  --  12 10  --   AST 24  --  24 16  --   GLUCOSE 101*  < > 111* 104* 91  < > = values in this interval not displayed.  Henderson [place], [employer] Student MS IV  02/23/2013, 9:34 AM    Upper Level Addendum:  I have seen and evaluated this patient along with the MS4 and reviewed and agree with the above note.  General: no acute distress, lying in bed   Cardiovascular: regular rate and rhythm, no murmurs Respiratory: clear to auscultation bilaterally Abdomen: nondistended, nontender, normal bowel sounds Back: mild CVA TTP Extremities: no edema peripherally  A/P:  Jay Simon is a 22yo male with a past medical history of recently diagnosed HIV, who presented with fevers, night sweats, and flank pain.   UTI/Pyelonephritis/Sepsis: Urine and blood cultures grew E coli. With slight CVA tenderness likely related to infection.  - s/p Vanc 100mg  q8 IV, Zosyn 3.375g IV q8, and Flagyl 500mg  IV q8 x 1 day and CTX x 1 day; switched to PO keflex x1day - restart Ceftriaxone IV x7 days per ID recommendations, then switch to PO - will likely need to complete this course in the hospital given comorbidities  HIV positive: previously untreated - appreciate ID recommendations, plan for f/u in clinic - viral load 35,536 - CD4 100, bactrim needed as prophylaxis - appropriate vaccinations given  Anemia: Hgb stabalized today at 8.3, 12.5 at admission. May be related to rectal bleeding - GI consult appreciated - patient refused rectal exam and GI did not think he would tolerate a colonoscopy, signed off - will need anoscopy in clinic, to be set up through ID - continue to monitor Hgb trend closely   Hep C reactive:  - f/u viral count and genotype  - LFTs stable  Protein Malnutrition - Albumin 1.5 - may also be due to proteinuria, so need to repeat u/a when infection clear - give resource breeze nutrition supplements TID   FEN/GI: Full diet   PPx: heparin  Disposition: Pt requires 7 days IV CTX per ID recommendations, d/c pending completion of this regimen  Marikay Alar, MD Family Medicine PGY-2

## 2013-02-23 NOTE — Care Management Note (Signed)
   CARE MANAGEMENT NOTE 02/23/2013  Patient:  Jay Simon, Jay Simon   Account Number:  [id number]  Date Initiated:  02/23/2013  Documentation initiated by:  Johny Shock  Subjective/Objective Assessment:   Pt with 042, hep C, untreated, now with ID consult     Action/Plan:   Pt will be followed in ID clinic, pt eligible for MATCH. Will prepare MATCH letter.   Anticipated DC Date:  02/24/2013   Anticipated DC Plan:  HOME/SELF CARE         Choice offered to / List presented to:             Status of service:  Completed, signed off Medicare Important Message given?   (If response is "NO", the following Medicare IM given date fields will be blank) Date Medicare IM given:   Date Additional Medicare IM given:    Discharge Disposition:  HOME/SELF CARE  Per UR Regulation:    If discussed at Long Length of Stay Meetings, dates discussed:    Comments:  02/23/2013 Met with pt and partner re d/c needs. Will prepare MATCH letter to assist with any meds other than AIDS meds and pain meds at time of d/c. Pt RN, Susie informed that MATCH letter will be placed in front of pt shadow chart to be given to pt at time of d/c with prescriptions. Johny Shock RN MPH, [phone]

## 2013-02-23 NOTE — Progress Notes (Signed)
FMTS Attending Daily Note:  Renold Don MD  [phone] pager  Family Practice pager:  [phone] I have seen and examined this patient and have reviewed their chart. I have discussed this patient with the resident. I agree with the resident's findings, assessment and care plan.  Additionally:  - In light of perinephric abscess, agree with continued IV Rocephin.  - Appreciate ID input.    Tobey Grim, MD 02/23/2013

## 2013-02-23 NOTE — Progress Notes (Signed)
Family Practice Teaching Service Interval Progress Note  Went to discuss antibiotic treatment with the patient moving forward. Discussed the need for continued IV antibiotics given his initial clinical presentation. This means needing to stay in the hospital for the next 4 days to complete a full week of IV antibiotics. Patient states he is not amenable to this as this would mean he would be in the hospital through the weekend and he has family coming to town this weekend. He stated this was a very important holiday for him as it is the first that he has "come out to my family". Will discuss further the option of sending home with home health for rocephin IM treatments through the weekend.   Marikay Alar, MD Family Medicine PGY-2 Service Pager [phone]

## 2013-02-23 NOTE — ED Provider Notes (Signed)
Medical screening examination/treatment/procedure(s) were performed by non-physician practitioner and as supervising physician I was immediately available for consultation/collaboration.  EKG Interpretation    Date/Time:  Sunday February 20 2013 09:56:39 EST Ventricular Rate:  131 PR Interval:  139 QRS Duration: 93 QT Interval:  304 QTC Calculation: 449 R Axis:   84 Text Interpretation:  Sinus tachycardia Borderline T wave abnormalities No previous tracing Confirmed by Anitra Lauth  MD, Meilech Virts ([id number]) on 02/20/2013 12:04:44 PM              Gwyneth Sprout, MD 02/23/13 1418

## 2013-02-24 LAB — HEPATITIS C GENOTYPE

## 2013-02-24 LAB — HIV-1 GENOTYPR PLUS

## 2013-02-24 LAB — BASIC METABOLIC PANEL
BUN: 7 mg/dL (ref 6–23)
CO2: 23 mEq/L (ref 19–32)
Chloride: 109 mEq/L (ref 96–112)
GFR calc Af Amer: >90 mL/min (ref >90)
Glucose, Bld: 97 mg/dL (ref 70–99)
Potassium: 3.8 mEq/L (ref 3.5–5.1)
Sodium: 139 mEq/L (ref 135–145)

## 2013-02-24 LAB — CBC
HCT: 27.3 % — ABNORMAL LOW (ref 39.0–52.0)
Hemoglobin: 8.9 g/dL — ABNORMAL LOW (ref 13.0–17.0)
WBC: 5.3 10*3/uL (ref 4.0–10.5)

## 2013-02-24 LAB — HCV RNA QUANT: HCV Quantitative Log: 6.28 {Log} — ABNORMAL HIGH (ref <1.18)

## 2013-02-24 NOTE — Progress Notes (Signed)
INFECTIOUS DISEASE PROGRESS NOTE  ID: Jay Simon is a 22 y.o. 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   Renal abscess, right   Protein-calorie malnutrition, severe  Subjective: Without complains  Abtx:  Anti-infectives   Start     Dose/Rate Route Frequency Ordered Stop   02/23/13 1000  cefTRIAXone (ROCEPHIN) 1 g in dextrose 5 % 50 mL IVPB     1 g 100 mL/hr over 30 Minutes Intravenous Every 24 hours 02/23/13 0838     02/22/13 1600  sulfamethoxazole-trimethoprim (BACTRIM DS) 800-160 MG per tablet 1 tablet     1 tablet Oral Daily 02/22/13 1234     02/22/13 1245  cephALEXin (KEFLEX) capsule 500 mg  Status:  Discontinued     500 mg Oral 4 times per day 02/22/13 1234 02/23/13 0838   02/21/13 1700  cefTRIAXone (ROCEPHIN) 2 g in dextrose 5 % 50 mL IVPB  Status:  Discontinued     2 g 100 mL/hr over 30 Minutes Intravenous Every 24 hours 02/21/13 1547 02/22/13 1234   02/20/13 2300  metroNIDAZOLE (FLAGYL) IVPB 500 mg  Status:  Discontinued     500 mg 100 mL/hr over 60 Minutes Intravenous Every 8 hours 02/20/13 2245 02/21/13 2035   02/20/13 2230  piperacillin-tazobactam (ZOSYN) IVPB 3.375 g  Status:  Discontinued     3.375 g 12.5 mL/hr over 240 Minutes Intravenous 3 times per day 02/20/13 2218 02/21/13 1546   02/20/13 2230  vancomycin (VANCOCIN) IVPB 1000 mg/200 mL premix  Status:  Discontinued     1,000 mg 200 mL/hr over 60 Minutes Intravenous Every 8 hours 02/20/13 2218 02/21/13 1546   02/20/13 1800  cefoTAXime (CLAFORAN) 1 g in dextrose 5 % 50 mL IVPB  Status:  Discontinued     1 g 100 mL/hr over 30 Minutes Intravenous 4 times per day 02/20/13 1547 02/20/13 2200   02/20/13 1245  cefTRIAXone (ROCEPHIN) 2 g in dextrose 5 % 50 mL IVPB     2 g 100 mL/hr over 30 Minutes Intravenous  Once 02/20/13 1240 02/20/13 1520      Medications:  Scheduled: . cefTRIAXone  (ROCEPHIN)  IV  1 g Intravenous Q24H  . feeding supplement (RESOURCE BREEZE)  1 Container Oral TID BM  . heparin subcutaneous  5,000 Units Subcutaneous Q8H  . influenza vac split quadrivalent PF  0.5 mL Intramuscular Tomorrow-1000  . pneumococcal 23 valent vaccine  0.5 mL Intramuscular Tomorrow-1000  . sodium chloride  3 mL Intravenous Q12H  . sulfamethoxazole-trimethoprim  1 tablet Oral Daily    Objective: Vital signs in last 24 hours: Temp:  [97.9 F (36.6 C)-98.6 F (37 C)] 98.2 F (36.8 C) (12/04 0905) Pulse Rate:  [71-91] 79 (12/04 0905) Resp:  [18-20] 20 (12/04 0905) BP: (98-110)/(58-70) 110/70 mmHg (12/04 0905) SpO2:  [99 %-100 %] 100 % (12/04 0905)   General appearance: alert and no distress Resp: clear to auscultation bilaterally Cardio: regular rate and rhythm GI: normal findings: bowel sounds normal and soft, non-tender  Lab Results  Recent Labs  02/23/13 0558 02/24/13 0615  WBC 4.9 5.3  HGB 8.3* 8.9*  HCT 25.2* 27.3*  NA 137 139  K 3.7 3.8  CL 109 109  CO2 22 23  BUN 7 7  CREATININE 0.75 0.68   Liver Panel  Recent Labs  02/22/13 0650  PROT 6.4  ALBUMIN 1.5*  AST  16  ALT 10  ALKPHOS 45  BILITOT 0.1*   Sedimentation Rate No results found for this basename: ESRSEDRATE,  in the last 72 hours C-Reactive Protein No results found for this basename: CRP,  in the last 72 hours  Microbiology: Recent Results (from the past 240 hour(s))  CULTURE, BLOOD (ROUTINE X 2)     Status: None   Collection Time    02/20/13 10:00 AM      Result Value Range Status   Specimen Description BLOOD LEFT ARM   Final   Special Requests BOTTLES DRAWN AEROBIC AND ANAEROBIC B10CC [facility]   Final   Culture  Setup Time     Final   Value: 02/20/2013 12:54     Performed at [facility]   Culture     Final   Value: ESCHERICHIA COLI     Note: Gram Stain Report Called to,Read Back By and Verified With: Jerl Mina RN on 02/21/13 at 00:35 by Christie Nottingham     Performed at  [facility]   Report Status 02/23/2013 FINAL   Final   Organism ID, Bacteria ESCHERICHIA COLI   Final  CULTURE, BLOOD (ROUTINE X 2)     Status: None   Collection Time    02/20/13 10:10 AM      Result Value Range Status   Specimen Description BLOOD LEFT HAND   Final   Special Requests BOTTLES DRAWN AEROBIC ONLY 10CC   Final   Culture  Setup Time     Final   Value: 02/20/2013 12:54     Performed at [facility]   Culture     Final   Value:        BLOOD CULTURE RECEIVED NO GROWTH TO DATE CULTURE WILL BE HELD FOR 5 DAYS BEFORE ISSUING A FINAL NEGATIVE REPORT     Performed at [facility]   Report Status PENDING   Incomplete  URINE CULTURE     Status: None   Collection Time    02/20/13 11:29 AM      Result Value Range Status   Specimen Description URINE, RANDOM   Final   Special Requests NONE   Final   Culture  Setup Time     Final   Value: 02/20/2013 17:04     Performed at [facility] Count     Final   Value: >=100,000 COLONIES/ML     Performed at [facility]   Culture     Final   Value: ESCHERICHIA COLI     Performed at [facility]   Report Status 02/22/2013 FINAL   Final   Organism ID, Bacteria ESCHERICHIA COLI   Final  GC/CHLAMYDIA PROBE AMP     Status: None   Collection Time    02/21/13  7:18 PM      Result Value Range Status   CT Probe RNA NEGATIVE  NEGATIVE Final   GC Probe RNA NEGATIVE  NEGATIVE Final   Comment: (NOTE)                                                                                               **  Normal Reference Range: Negative**          Assay performed using the Gen-Probe APTIMA COMBO2 (R) Assay.     Acceptable specimen types for this assay include APTIMA Swabs (Unisex,     endocervical, urethral, or vaginal), first void urine, and ThinPrep     liquid based cytology samples.     Performed at [facility] DIFFICILE BY PCR     Status: None   Collection Time     02/22/13 10:20 AM      Result Value Range Status   C difficile by pcr NEGATIVE  NEGATIVE Final    Studies/Results: No results found.   Assessment/Plan: AIDS UTI  Sepsis  Hyponatremia  Diarrhea  Total days of antibiotics: 4/7 (Ceftriaxone)  Bactrim  HIV 1 RNA Quant (copies/mL)  Date Value  02/21/2013 35536*  02/20/2013 115000*     CD4 T Cell Abs (/uL)  Date Value  02/21/2013 100*   Will work on getting him onto ADAP (aids drug assistance program) asap so he can start meds asap at d/c. My great appreciation to FPTS.  Could get mid-line and be d/c with this?        [name] Infectious Diseases (pager) [phone] [email or web address] 02/24/2013, 2:08 PM  LOS: 4 days

## 2013-02-24 NOTE — Progress Notes (Signed)
Family Medicine Teaching Service Daily Progress Note Intern Pager: [phone]  Patient name: Japhet Morgenthaler Medical record number: [id number] Date of birth: 09-26-1990 Age: 22 y.o. Gender: male  Primary Care Provider: No PCP Per Patient Consultants: ID, GI, CCM Code Status: Full Code  Pt Overview and Major Events to Date:  11/30 Admitted with pyelonephritis/sepsis 12/1 transitioned from Vanc/Zosyn to CTX 12/2 started on PO keflex for E. Coli and Bactrim for PCP, Toxo ppx  12/3 transition back to IV ceftriaxone  Assessment and Plan: Mr. Marez is a 22yo male with a past medical history of recently diagnosed HIV, who presented with fevers, night sweats, and flank pain.   UTI/Pyelonephritis/Sepsis: Urine and blood cultures grew gram negative rods.  Pt vital signs stable past 24hrs.  2 small renal abcesses were noted on CT scan.    - s/p Vanc 100mg  q8 IV, Zosyn 3.375g IV q8, and Flagyl 500mg  IV q8 x 1 day and CTX x 1 day; switched to PO keflex x1day - restart Ceftriaxone IV x7 days per ID, on day 4 recommendations, then switch to PO - will likely need to finish this regimen inpt, but pt would like to leave for weekend, so consider home health nurse - appreciate CCM and ID eval of pt and recommendations  HIV positive: Pt untreated for HIV, untreated - appreciate ID recommendations, plan for f/u in clinic - viral load 35,536 - CD4 100, bactrim needed as prophylaxis - plan f/u in ID clinic - urine GC/Chlamydia negative - start Hep A and Hep B vaccines - Give influenza and pneumonia vaccines - C.Diff negative  Hyponatremia: Resolved. Pt arrived with hyponatremia with unclear etiology; he was given NS. 124 at admission. - KVO - continue to monitor   Anemia: Trending back up slowly.  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  Eating and drinking well.  Continues to endorse small, pressure-like pain close to right costophrenic angle.  BM today, normal, no straining.  Objective: Temp:  [97.7 F (36.5 C)-98.6 F (37 C)] 98.2 F (36.8 C) (12/04 0905) Pulse Rate:  [71-91] 79 (12/04 0905) Resp:  [17-20] 20 (12/04 0905) BP: (98-111)/(58-75) 110/70 mmHg (12/04 0905) SpO2:  [99 %-100 %] 100 % (12/04 0905) Physical Exam: General: no acute distress, lying in bed  Cardiovascular: regular rate and rhythm, no murmurs Respiratory: clear to auscultation bilaterally Abdomen: nondistended, nontender, normal bowel sounds Extremities: no edema peripherally  Laboratory:  Recent Labs Lab 02/22/13 0650 02/23/13 0558 02/24/13 0615  WBC 6.4 4.9 5.3  HGB 8.3* 8.3* 8.9*  HCT 23.9* 25.2* 27.3*  PLT 168 221 282    Recent Labs Lab 02/20/13 0940  02/21/13 1040 02/22/13 0650 02/23/13 0558 02/24/13 0615  NA 124*  < > 135 136 137 139  K 3.4*  < > 4.2 3.1* 3.7 3.8  CL 89*  < > 107 108 109 109  CO2 24  < > 21 22 22 23   BUN 13  < >  7 10 7 7   CREATININE 1.16  < > 0.61 0.64 0.75 0.68  CALCIUM 8.3*  < > 7.7* 7.4* 7.6* 7.9*  PROT 9.7*  --  7.6 6.4  --   --   BILITOT 0.4  --  0.4 0.1*  --   --   ALKPHOS 49  --  47 45  --   --   ALT 13  --  12 10  --   --   AST 24  --  24 16  --   --   GLUCOSE 101*  < > 111* 104* 91 97  < > = values in this interval not displayed.  Henderson Baltimore, [employer] Medical Student MS IV  02/24/2013, 9:13 AM  Upper Level Addendum:  I have seen and evaluated this patient along with the MS4 and reviewed and agree with the  above note.   General: no acute distress, lying in bed  Cardiovascular: regular rate and rhythm, no murmurs  Respiratory: clear to auscultation bilaterally  Abdomen: nondistended, nontender, normal bowel sounds  Back: mild CVA TTP, though improved from yesterday Extremities: no edema peripherally   A/P:  Mr. Rothert is a 21yo male with a past medical history of recently diagnosed HIV, who presented with fevers, night sweats, and flank pain.   UTI/Pyelonephritis/Sepsis: Urine and blood cultures grew E coli. With slight CVA tenderness likely related to infection.  - s/p Vanc 100mg  q8 IV, Zosyn 3.375g IV q8, and Flagyl 500mg  IV q8 x 1 day and CTX x 1 day; switched to PO keflex x1day, back to CTX IV 12/3  - continue Ceftriaxone IV x7 days per ID recommendations (on day 4 IV antibiotics), then switch to PO  - will need to complete this course in the hospital given comorbidities and patient is amenable to this today  HIV positive: previously untreated  - appreciate ID recommendations, plan for f/u in clinic  - viral load 35,536  - CD4 100, continue bactrim prophylaxis  - appropriate vaccinations given   Anemia: Hgb improved today, 12.5 at admission. May be related to rectal bleeding.  - GI consult appreciated - patient refused rectal exam and GI did not think he would tolerate a colonoscopy, signed off  - will need anoscopy in clinic given history of anal condyloma, to be set up through ID  - continue to monitor Hgb trend closely   Hep C reactive:  - viral count 1,245,809 - genotype 1a - LFTs stable  - will need to confirm with patient if this is a new diagnosis if this is chronic may be beneficial to start treatment in treatment naive patient - will have follow-up in ID clinic  Protein Malnutrition - Albumin 1.5  - give resource breeze nutrition supplements TID   FEN/GI: Full diet  PPx: heparin   Disposition: Pt requires 7 days IV antibiotics per ID recommendations, d/c pending  completion of this regimen, last day should be Sunday    Marikay Alar, MD  Family Medicine PGY-2

## 2013-02-24 NOTE — Progress Notes (Signed)
FMTS Attending Daily Note:  Renold Don MD  [phone] pager  Family Practice pager:  [phone] I have seen and examined this patient and have reviewed their chart. I have discussed this patient with the resident. I agree with the resident's findings, assessment and care plan.  Tobey Grim, MD 02/24/2013 1:35 PM

## 2013-02-25 DIAGNOSIS — E43 Unspecified severe protein-calorie malnutrition: Secondary | ICD-10-CM

## 2013-02-25 LAB — CBC
MCH: 30 pg (ref 26.0–34.0)
MCV: 93 fL (ref 78.0–100.0)
Platelets: 406 10*3/uL — ABNORMAL HIGH (ref 150–400)
RBC: 3.27 MIL/uL — ABNORMAL LOW (ref 4.22–5.81)
RDW: 15.9 % — ABNORMAL HIGH (ref 11.5–15.5)
WBC: 7 10*3/uL (ref 4.0–10.5)

## 2013-02-25 LAB — BASIC METABOLIC PANEL
BUN: 5 mg/dL — ABNORMAL LOW (ref 6–23)
CO2: 26 mEq/L (ref 19–32)
Calcium: 8.4 mg/dL (ref 8.4–10.5)
Chloride: 103 mEq/L (ref 96–112)
Creatinine, Ser: 0.66 mg/dL (ref 0.50–1.35)
GFR calc non Af Amer: >90 mL/min (ref >90)
Glucose, Bld: 81 mg/dL (ref 70–99)
Sodium: 135 mEq/L (ref 135–145)

## 2013-02-25 LAB — HIV-1 GENOTYPR PLUS

## 2013-02-25 MED ORDER — DEXTROSE 5 % IV SOLN: 1.0000 g | INTRAVENOUS | Status: DC

## 2013-02-25 MED ORDER — SULFAMETHOXAZOLE-TMP DS 800-160 MG PO TABS: 1.0000 | ORAL_TABLET | Freq: Every day | ORAL | Status: DC

## 2013-02-25 MED ORDER — CEPHALEXIN 500 MG PO CAPS: 500.0000 mg | ORAL_CAPSULE | Freq: Four times a day (QID) | ORAL | Status: DC

## 2013-02-25 NOTE — Progress Notes (Signed)
INFECTIOUS DISEASE PROGRESS NOTE  ID: Jay Simon is a 22 y.o. 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   Renal abscess, right   Protein-calorie malnutrition, severe  Subjective: Without complaints  Abtx:  Anti-infectives   Start     Dose/Rate Route Frequency Ordered Stop   02/23/13 1000  cefTRIAXone (ROCEPHIN) 1 g in dextrose 5 % 50 mL IVPB     1 g 100 mL/hr over 30 Minutes Intravenous Every 24 hours 02/23/13 0838     02/22/13 1600  sulfamethoxazole-trimethoprim (BACTRIM DS) 800-160 MG per tablet 1 tablet     1 tablet Oral Daily 02/22/13 1234     02/22/13 1245  cephALEXin (KEFLEX) capsule 500 mg  Status:  Discontinued     500 mg Oral 4 times per day 02/22/13 1234 02/23/13 0838   02/21/13 1700  cefTRIAXone (ROCEPHIN) 2 g in dextrose 5 % 50 mL IVPB  Status:  Discontinued     2 g 100 mL/hr over 30 Minutes Intravenous Every 24 hours 02/21/13 1547 02/22/13 1234   02/20/13 2300  metroNIDAZOLE (FLAGYL) IVPB 500 mg  Status:  Discontinued     500 mg 100 mL/hr over 60 Minutes Intravenous Every 8 hours 02/20/13 2245 02/21/13 2035   02/20/13 2230  piperacillin-tazobactam (ZOSYN) IVPB 3.375 g  Status:  Discontinued     3.375 g 12.5 mL/hr over 240 Minutes Intravenous 3 times per day 02/20/13 2218 02/21/13 1546   02/20/13 2230  vancomycin (VANCOCIN) IVPB 1000 mg/200 mL premix  Status:  Discontinued     1,000 mg 200 mL/hr over 60 Minutes Intravenous Every 8 hours 02/20/13 2218 02/21/13 1546   02/20/13 1800  cefoTAXime (CLAFORAN) 1 g in dextrose 5 % 50 mL IVPB  Status:  Discontinued     1 g 100 mL/hr over 30 Minutes Intravenous 4 times per day 02/20/13 1547 02/20/13 2200   02/20/13 1245  cefTRIAXone (ROCEPHIN) 2 g in dextrose 5 % 50 mL IVPB     2 g 100 mL/hr over 30 Minutes Intravenous  Once 02/20/13 1240 02/20/13 1520      Medications:  Scheduled: . cefTRIAXone  (ROCEPHIN)  IV  1 g Intravenous Q24H  . feeding supplement (RESOURCE BREEZE)  1 Container Oral TID BM  . heparin subcutaneous  5,000 Units Subcutaneous Q8H  . influenza vac split quadrivalent PF  0.5 mL Intramuscular Tomorrow-1000  . pneumococcal 23 valent vaccine  0.5 mL Intramuscular Tomorrow-1000  . sodium chloride  3 mL Intravenous Q12H  . sulfamethoxazole-trimethoprim  1 tablet Oral Daily    Objective: Vital signs in last 24 hours: Temp:  [97.6 F (36.4 C)-98.5 F (36.9 C)] 98 F (36.7 C) (12/05 1302) Pulse Rate:  [71-83] 71 (12/05 1302) Resp:  [18] 18 (12/05 1302) BP: (98-116)/(61-74) 107/70 mmHg (12/05 1302) SpO2:  [99 %-100 %] 100 % (12/05 1302) Weight:  [66.18 kg (145 lb 14.4 oz)] 66.18 kg (145 lb 14.4 oz) (12/04 2041)   General appearance: alert, cooperative and no distress Resp: clear to auscultation bilaterally Cardio: regular rate and rhythm GI: normal findings: bowel sounds normal and soft, non-tender  Lab Results  Recent Labs  02/23/13 0558 02/24/13 0615 02/25/13 0920  WBC 4.9 5.3 7.0  HGB 8.3* 8.9* 9.8*  HCT 25.2* 27.3* 30.4*  NA 137 139  --   K 3.7 3.8  --   CL 109 109  --  CO2 22 23  --   BUN 7 7  --   CREATININE 0.75 0.68  --    Liver Panel No results found for this basename: PROT, ALBUMIN, AST, ALT, ALKPHOS, BILITOT, BILIDIR, IBILI,  in the last 72 hours Sedimentation Rate No results found for this basename: ESRSEDRATE,  in the last 72 hours C-Reactive Protein No results found for this basename: CRP,  in the last 72 hours  Microbiology: Recent Results (from the past 240 hour(s))  CULTURE, BLOOD (ROUTINE X 2)     Status: None   Collection Time    02/20/13 10:00 AM      Result Value Range Status   Specimen Description BLOOD LEFT ARM   Final   Special Requests BOTTLES DRAWN AEROBIC AND ANAEROBIC B10CC [facility]   Final   Culture  Setup Time     Final   Value: 02/20/2013 12:54     Performed at [facility]   Culture     Final   Value:  ESCHERICHIA COLI     Note: Gram Stain Report Called to,Read Back By and Verified With: Jerl Mina RN on 02/21/13 at 00:35 by Christie Nottingham     Performed at [facility]   Report Status 02/23/2013 FINAL   Final   Organism ID, Bacteria ESCHERICHIA COLI   Final  CULTURE, BLOOD (ROUTINE X 2)     Status: None   Collection Time    02/20/13 10:10 AM      Result Value Range Status   Specimen Description BLOOD LEFT HAND   Final   Special Requests BOTTLES DRAWN AEROBIC ONLY 10CC   Final   Culture  Setup Time     Final   Value: 02/20/2013 12:54     Performed at [facility]   Culture     Final   Value:        BLOOD CULTURE RECEIVED NO GROWTH TO DATE CULTURE WILL BE HELD FOR 5 DAYS BEFORE ISSUING A FINAL NEGATIVE REPORT     Performed at [facility]   Report Status PENDING   Incomplete  URINE CULTURE     Status: None   Collection Time    02/20/13 11:29 AM      Result Value Range Status   Specimen Description URINE, RANDOM   Final   Special Requests NONE   Final   Culture  Setup Time     Final   Value: 02/20/2013 17:04     Performed at [facility] Count     Final   Value: >=100,000 COLONIES/ML     Performed at [facility]   Culture     Final   Value: ESCHERICHIA COLI     Performed at [facility]   Report Status 02/22/2013 FINAL   Final   Organism ID, Bacteria ESCHERICHIA COLI   Final  GC/CHLAMYDIA PROBE AMP     Status: None   Collection Time    02/21/13  7:18 PM      Result Value Range Status   CT Probe RNA NEGATIVE  NEGATIVE Final   GC Probe RNA NEGATIVE  NEGATIVE Final   Comment: (NOTE)                                                                                               **  Normal Reference Range: Negative**          Assay performed using the Gen-Probe APTIMA COMBO2 (R) Assay.     Acceptable specimen types for this assay include APTIMA Swabs (Unisex,     endocervical, urethral, or vaginal), first void urine, and  ThinPrep     liquid based cytology samples.     Performed at [facility] DIFFICILE BY PCR     Status: None   Collection Time    02/22/13 10:20 AM      Result Value Range Status   C difficile by pcr NEGATIVE  NEGATIVE Final    Studies/Results: No results found.   Assessment/Plan: AIDS  UTI  Sepsis  Hyponatremia  Diarrhea  Total days of antibiotics 5/7 ceftriaxone Met with CM yesterday, getting ADAP process started.  Complete anbx, IV then home with PO for another week.  Has ID appt on 03-03-13.  Dr Daiva Eves available to see over weekend if needed.          [name] Infectious Diseases (pager) [phone] [email or web address] 02/25/2013, 1:50 PM  LOS: 5 days

## 2013-02-25 NOTE — Care Management Note (Signed)
   CARE MANAGEMENT NOTE 02/25/2013  Patient:  Jay Simon, Jay Simon   Account Number:  [id number]  Date Initiated:  02/23/2013  Documentation initiated by:  Johny Shock  Subjective/Objective Assessment:   Pt with 042, hep C, untreated, now with ID consult     Action/Plan:   Pt will be followed in ID clinic, pt eligible for MATCH. Will prepare MATCH letter.  02/25/2013 Pt will d/c to home with [facility] for [facility] to manager perph IV and two days of IV Rocephin.   Anticipated DC Date:  02/25/2013   Anticipated DC Plan:  HOME W HOME HEALTH SERVICES         Choice offered to / List presented to:             Status of service:  Completed, signed off Medicare Important Message given?   (If response is "NO", the following Medicare IM given date fields will be blank) Date Medicare IM given:   Date Additional Medicare IM given:    Discharge Disposition:  HOME W HOME HEALTH SERVICES  Per UR Regulation:    If discussed at Long Length of Stay Meetings, dates discussed:    Comments:  02/25/2013 [facility] contacted for [facility] services. This pt will only need two days of IV Rocephin daily, therefore can use perph IV, MD notified.                 Johny Shock RN MPH, case manager, [phone]  02/23/2013 Met with pt and partner re d/c needs. Will prepare MATCH letter to assist with any meds other than AIDS meds and pain meds at time of d/c. Pt RN, Susie informed that MATCH letter will be placed in front of pt shadow chart to be given to pt at time of d/c with prescriptions. Johny Shock RN MPH, [phone]

## 2013-02-25 NOTE — Progress Notes (Signed)
IV team at bedside attempted to restart IV for d/c home, pt refusing attempt. MD notified, pt states he will stay in hospital for antibiotics. Will continue to monitor.

## 2013-02-25 NOTE — Progress Notes (Signed)
FMTS Attending Daily Note:  Renold Don MD  [phone] pager  Family Practice pager:  [phone] I have discussed this patient with the resident Dr. Birdie Sons and student doctor Henderson [place].  I agree with their findings, assessment, and care plan.  Plan is to DC home today with mid-line catheter only IF we can obtain home health Rocephin injections.  Greatly appreciate ID recommendations and getting him set up with outpt HIV treatment.

## 2013-02-25 NOTE — Progress Notes (Signed)
Pt ambulatory off of unit, given D/c papers, Match letter. Instructed on importance of follow up appts. RX given.

## 2013-02-25 NOTE — Discharge Summary (Cosign Needed)
Family Medicine Teaching [facility] Discharge Summary  Patient name: Jay Simon Medical record number: [id number] Date of birth: 06/21/1990 Age: 22 y.o. Gender: male Date of Admission: 02/20/2013  Date of Discharge: 02/25/2013 Admitting Physician: Uvaldo Rising, MD  Primary Care Provider: No PCP Per Patient Consultants: ID, GI, CCM  Indication for Hospitalization: Sepsis and pyelonephritis  Discharge Diagnoses/Problem List:  S/p Sepsis S/p Pyelonephritis HIV positive Normocytic Anemia S/p Syphilis Treatment HepC Acute Infection Protein Malnutrition  Disposition: Discharge home with home health to complete 2 more days of IV antibiotics (last day 12/7)  Discharge Condition: stable  Brief Hospital Course: Jay Simon was admitted 11/30 with fever, night sweats, and flank pain.  He was septic with low BP, fever, and tachycardia.  Urine and blood cultures grew E. Coli.  CT revealed 2 small renal abcesses and gross hepatosplenomegaly.  He was treated with IV Ceftriaxone x7days for complicated pyelonephritis that failed outpt. oral antibiotics previously.  Pt discharged after 5 inpt. Days of antibiotics with home health for last 2 days of IV Ceftriaxone to be completed at home.      On admission, pt noted to have untreated HIV with viral load of 35,536 and CD4 100.  Bactrim prophylaxis was started.  Hep A and Hep B vaccines were given along with influenza and pneumonia vaccines.  Pt. Seen by Dr. Ninetta Lights inpt. and plans to follow pt in clinic.  Pt also found to have previously treated syphilis with a titer of 1:2.  Active Hep C infection, genotype 1a, with viral load of 7,829,562 was found and LFTs were stable throughout admission.      Pt presented with normocytic anemia with a hemoglobin of 9.1, this remained stable and increased to 9.8 by discharge.    Issues for Follow Up: follow-up with Dr. Ninetta Lights in ID clinic for HIV treatment, Hep C follow-up, and follow-up of HPV  status.  Significant Labs and Imaging:   Recent Labs Lab 02/23/13 0558 02/24/13 0615 02/25/13 0920  WBC 4.9 5.3 7.0  HGB 8.3* 8.9* 9.8*  HCT 25.2* 27.3* 30.4*  PLT 221 282 406*    Recent Labs Lab 02/20/13 0940  02/20/13 1905 02/21/13 1040 02/22/13 0650 02/23/13 0558 02/24/13 0615  NA 124*  < > 127* 135 136 137 139  K 3.4*  < > 3.3* 4.2 3.1* 3.7 3.8  CL 89*  < > 98 107 108 109 109  CO2 24  < > 22 21 22 22 23   GLUCOSE 101*  < > 107* 111* 104* 91 97  BUN 13  < > 9 7 10 7 7   CREATININE 1.16  < > 0.86 0.61 0.64 0.75 0.68  CALCIUM 8.3*  < > 7.2* 7.7* 7.4* 7.6* 7.9*  MG  --   --   --  2.1  --   --   --   ALKPHOS 49  --   --  47 45  --   --   AST 24  --   --  24 16  --   --   ALT 13  --   --  12 10  --   --   ALBUMIN 2.4*  --   --  1.7* 1.5*  --   --   < > = values in this interval not displayed.  11/30 CT of Abdomen and Pelvis with contrast FINDINGS:  Mild haziness at the left lung base is suggestive for mild  atelectasis. There is no evidence for free intraperitoneal air.  There is at least mild gallbladder wall thickening. Difficult to  evaluate for gallstones on CT. There is mild periportal edema which is new or more conspicuous compared to the previous examination.  Again noted is a prominent spleen measuring 13.7 x 13.9 x 6.9 cm.  Estimated splenic volume is 657 ml. No gross abnormality to the pancreas or adrenal tissue. The liver is large measuring 22.2 cm in the craniocaudal dimension. The left kidney has a normal appearance.  The right kidney has a very heterogeneous perfusion pattern and findings are concerning for pyelonephritis. In addition, there is a new 1.8 cm low-density structure along the posterior right kidney upper pole. There are two adjacent low density structures along the medial aspect of the left kidney measuring 1.5 and 0.8 cm. These low density structures have rapidly developed since 01/15/2013 and highly concerning for an inflammatory process and  abscess  collections. The right kidney has enlarged compared to the prior examination, measuring 13.7 cm in length and previously measuring 11.6 cm.  No definite free fluid or lymphadenopathy. Prominent vessels in the pelvis are poorly characterized on this examination. Small amount of fluid in the urinary bladder. There appears to be gas and contrast within the appendix but this area is difficult to visualize due to the lack of intra-abdominal fat.  Small amount of gas within the right anterior subcutaneous tissues are probably related to an injection site. Small bilateral inguinal lymph nodes are stable from the previous examination.  No acute bone abnormality.  IMPRESSION:  Enlargement and heterogeneous appearance of the right kidney with new small low-density structures in the upper pole. Findings are most compatible with acute pyelonephritis and probable small abscesses.  Mild wall thickening in the gallbladder with periportal edema.  Consider further evaluation with an ultrasound to evaluate for gallstones.  Hepatomegaly and splenomegaly.  Discharge Medications:    Medication List    ASK your doctor about these medications       OVER THE COUNTER MEDICATION  Take 2 tablets by mouth daily as needed (pain). [employer]        Discharge Instructions: Please refer to Patient Instructions section of EMR for full details.  Patient was counseled important signs and symptoms that should prompt return to medical care, changes in medications, dietary instructions, activity restrictions, and follow up appointments.   Follow-Up Appointments:   Rayburn Ma, Med Student 02/25/2013, 1:43 PM [facility]

## 2013-02-25 NOTE — Progress Notes (Signed)
IV team notified of pt need for a new peripheral IV for discharge. Will continue to monitor.

## 2013-02-25 NOTE — Progress Notes (Signed)
Family Medicine Teaching Service Daily Progress Note Intern Pager: [phone]  Patient name: Jay Simon Medical record number: [id number] Date of birth: 28-Sep-1990 Age: 22 y.o. Gender: male  Primary Care Provider: No PCP Per Patient Consultants: ID, GI, CCM Code Status: Full Code  Pt Overview and Major Events to Date:  11/30 Admitted with pyelonephritis/sepsis 12/1 transitioned from Vanc/Zosyn to CTX 12/2 started on PO keflex for E. Coli and Bactrim for PCP, Toxo ppx  12/3 transition back to IV ceftriaxone  Assessment and Plan: Jay Simon is a 22yo male with a past medical history of recently diagnosed HIV, who presented with fevers, night sweats, and flank pain.   UTI/Pyelonephritis/Sepsis: Urine and blood cultures grew gram negative rods.  Pt vital signs stable past 24hrs.  2 small renal abcesses were noted on CT scan.    - s/p Vanc 100mg  q8 IV, Zosyn 3.375g IV q8, and Flagyl 500mg  IV q8 x 1 day and CTX x 1 day; switched to PO keflex x1day - restart Ceftriaxone IV x7 days per ID recommendations, on day 5, then switch to PO - will likely need to finish this regimen inpt, but pt would like to leave for weekend, so midline cath may be option - appreciate CCM and ID eval of pt and recommendations  HIV positive: Pt untreated for HIV, untreated - appreciate ID recommendations, plan for f/u in clinic - viral load 35,536 - CD4 100, bactrim needed as prophylaxis - plan f/u in ID clinic - urine GC/Chlamydia negative - start Hep A and Hep B vaccines - Give influenza and pneumonia vaccines - C.Diff negative  Anemia: Trending back up slowly.  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  Still some nagging soreness in right costophrenic angle.  Regular BM.    Objective: Temp:  [97.8 F (36.6 C)-98.5 F (36.9 C)] 98.4 F (36.9 C) (12/05 0510) Pulse Rate:  [76-83] 83 (12/05 0510) Resp:  [18-20] 18 (12/05 0510) BP: (101-116)/(61-74) 101/72 mmHg (12/05 0510) SpO2:  [99 %-100 %] 100 % (12/05 0510) Weight:  [145 lb 14.4 oz (66.18 kg)] 145 lb 14.4 oz (66.18 kg) (12/04 2041)  Physical Exam: General: no acute distress, lying in bed  Cardiovascular: regular rate and rhythm, no murmurs Respiratory: clear to auscultation bilaterally Abdomen: nondistended, nontender, normal bowel sounds Extremities: no edema peripherally  Laboratory:  Recent Labs Lab 02/22/13 0650 02/23/13 0558 02/24/13 0615  WBC 6.4 4.9 5.3  HGB 8.3* 8.3* 8.9*  HCT 23.9* 25.2* 27.3*  PLT 168 221 282    Recent Labs Lab 02/20/13 0940  02/21/13 1040 02/22/13 0650 02/23/13 0558 02/24/13 0615  NA 124*  < > 135 136 137 139  K 3.4*  < > 4.2 3.1* 3.7 3.8  CL 89*  < > 107 108 109 109  CO2 24  < > 21 22 22 23   BUN 13  < > 7 10 7 7   CREATININE 1.16  < > 0.61 0.64  0.75 0.68  CALCIUM 8.3*  < > 7.7* 7.4* 7.6* 7.9*  PROT 9.7*  --  7.6 6.4  --   --   BILITOT 0.4  --  0.4 0.1*  --   --   ALKPHOS 49  --  47 45  --   --   ALT 13  --  12 10  --   --   AST 24  --  24 16  --   --   GLUCOSE 101*  < > 111* 104* 91 97  < > = values in this interval not displayed.  Jay Simon, [employer] Medical Student MS IV  02/25/2013, 7:57 AM  Upper Level Addendum:  I have seen and evaluated this patient along with the MS4 and reviewed and agree with the above note.   General: no acute distress, lying in bed  Cardiovascular: regular rate and rhythm, no murmurs  Respiratory:  clear to auscultation bilaterally  Abdomen: nondistended, nontender, normal bowel sounds  Back: mild CVA TTP, continues to improve Extremities: no edema peripherally   A/P:  Jay Simon is a 22yo male with a past medical history of recently diagnosed HIV, who presented with fevers, night sweats, and flank pain.   UTI/Pyelonephritis/Sepsis: Urine and blood cultures grew E coli sensitive to TCX.  - s/p Vanc 100mg  q8 IV, Zosyn 3.375g IV q8, and Flagyl 500mg  IV q8 x 1 day and CTX x 1 day; switched to PO keflex x1day, back to CTX IV 12/3  - continue Ceftriaxone IV x7 days per ID recommendations (on day 5 IV antibiotics), then switch to PO-likely to complete the course in the hospital   HIV positive: previously untreated  - appreciate ID recommendations, plan for f/u in clinic  - viral load 35,536  - CD4 100, continue bactrim prophylaxis  - appropriate vaccinations given   Anemia: 12.5 at admission. May be related to rectal bleeding.  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

## 2013-02-26 LAB — CULTURE, BLOOD (ROUTINE X 2): Culture: NO GROWTH

## 2013-02-27 NOTE — Discharge Summary (Signed)
Family Medicine Teaching [facility] Discharge Summary  Patient name: Jay Simon Medical record number: [id number] Date of birth: April 23, 1990 Age: 22 y.o. Gender: male Date of Admission: 02/20/2013  Date of Discharge: 02/25/2013 Admitting Physician: Uvaldo Rising, MD  Primary Care Provider: No PCP Per Patient Consultants: ID, GI, CCM  Indication for Hospitalization: Sepsis and pyelonephritis  Discharge Diagnoses/Problem List:  Sepsis Pyelonephritis HIV positive Normocytic Anemia S/p Syphilis Treatment HepC Acute Infection Protein Malnutrition  Disposition: Discharge home with home health to complete 2 more days of IV antibiotics (last day 12/7) then to complete 7 day course of PO keflex.  Discharge Condition: stable  Brief Hospital Course: Jay Simon was admitted 11/30 with fever, night sweats, and flank pain.  He was septic with low BP, fever, and tachycardia.  Urine and blood cultures grew E. Coli.  CT revealed 2 small renal abcesses and gross hepatosplenomegaly.  He was treated initially with Vanc 100mg  q8 IV, Zosyn 3.375g IV q8, and Flagyl 500mg  IV q8 x 1 day and then CTX x 1 day; switched to PO keflex x1day. The patient was then transitioned back to IV CTX to complete a 7 day course of IV antibiotics for complicated pyelonephritis.  Pt discharged after 5 inpt. days of antibiotics with home health for last 2 days of IV Ceftriaxone to be completed at home. The patient had resolution of his symptoms by the time of discharge.  On admission, pt noted to have untreated HIV with viral load of 35,536 and CD4 100.  Bactrim prophylaxis was started.  Hep A and Hep B vaccines were given along with influenza and pneumonia vaccines.  Pt. Seen by Dr. Ninetta Lights inpt. and plans to follow pt in clinic.  Pt also found to have previously treated syphilis with a titer of 1:2.  Active Hep C infection, genotype 1a, with viral load of 4,098,119 was found and LFTs were stable throughout admission.       Pt presented with normocytic anemia with a hemoglobin of 9.1, this declined to 8.3 and increased to 9.8 by discharge.    Issues for Follow Up: 1. follow-up with Dr. Ninetta Lights in ID clinic for HIV treatment, Hep C follow-up, and follow-up of Jay Simon. 2. Completion of antibiotic course 3. Anoscopy given history of anal condyloma and anemia  Significant Labs and Imaging:   Recent Labs Lab 02/23/13 0558 02/24/13 0615 02/25/13 0920  WBC 4.9 5.3 7.0  HGB 8.3* 8.9* 9.8*  HCT 25.2* 27.3* 30.4*  PLT 221 282 406*    Recent Labs Lab 02/20/13 0940  02/20/13 1905 02/21/13 1040 02/22/13 0650 02/23/13 0558 02/24/13 0615  NA 124*  < > 127* 135 136 137 139  K 3.4*  < > 3.3* 4.2 3.1* 3.7 3.8  CL 89*  < > 98 107 108 109 109  CO2 24  < > 22 21 22 22 23   GLUCOSE 101*  < > 107* 111* 104* 91 97  BUN 13  < > 9 7 10 7 7   CREATININE 1.16  < > 0.86 0.61 0.64 0.75 0.68  CALCIUM 8.3*  < > 7.2* 7.7* 7.4* 7.6* 7.9*  MG  --   --   --  2.1  --   --   --   ALKPHOS 49  --   --  47 45  --   --   AST 24  --   --  24 16  --   --   ALT 13  --   --  12 10  --   --   ALBUMIN 2.4*  --   --  1.7* 1.5*  --   --   < > = values in this interval not displayed.  11/30 CT of Abdomen and Pelvis with contrast IMPRESSION:  Enlargement and heterogeneous appearance of the right kidney with new small low-density structures in the upper pole. Findings are most compatible with acute pyelonephritis and probable small abscesses.  Mild wall thickening in the gallbladder with periportal edema.  Consider further evaluation with an ultrasound to evaluate for gallstones.  Hepatomegaly and splenomegaly.  Discharge Medications:    Medication List         cephALEXin 500 MG capsule  Commonly known as:  KEFLEX  Take 1 capsule (500 mg total) by mouth 4 (four) times daily.     dextrose 5 % SOLN 50 mL with cefTRIAXone 1 G SOLR 1 g  Inject 1 g into the vein daily. For 30 minutes every 24 hours.     OVER THE COUNTER  MEDICATION  Take 2 tablets by mouth daily as needed (pain). [employer]     sulfamethoxazole-trimethoprim 800-160 MG per tablet  Commonly known as:  BACTRIM DS  Take 1 tablet by mouth daily.        Discharge Instructions: Please refer to Patient Instructions section of EMR for full details.  Patient was counseled important signs and symptoms that should prompt return to medical care, changes in medications, dietary instructions, activity restrictions, and follow up appointments.   Follow-Up Appointments:   Follow-up Information   Follow up with Staci Righter, MD On 03/03/2013. (3:15 pm)    Specialty:  Infectious Diseases   Contact information:   [address] [place] [address] [phone]       Follow up with Beverely Low, MD On 03/09/2013. (2:30 pm)    Specialty:  Family Medicine   Contact information:   [address] [place] [address] [phone]

## 2013-02-28 NOTE — Discharge Summary (Signed)
Family Medicine Teaching Service  Discharge Note : Attending Jeff Roseanna Koplin MD Pager [phone] Inpatient Team Pager:  [phone]  I have reviewed this patient and the patient's chart and have discussed discharge planning with the resident at the time of discharge. I agree with the discharge plan as above.    

## 2013-03-03 ENCOUNTER — Encounter: Payer: Self-pay | Admitting: Internal Medicine

## 2013-03-03 ENCOUNTER — Other Ambulatory Visit: Payer: Self-pay | Admitting: *Deleted

## 2013-03-03 ENCOUNTER — Ambulatory Visit (INDEPENDENT_AMBULATORY_CARE_PROVIDER_SITE_OTHER): Payer: Self-pay | Admitting: Internal Medicine

## 2013-03-03 VITALS — BP 107/69 | HR 94 | Temp 97.7°F | Ht 69.0 in | Wt 140.0 lb

## 2013-03-03 DIAGNOSIS — B20 Human immunodeficiency virus [HIV] disease: Secondary | ICD-10-CM

## 2013-03-03 DIAGNOSIS — I959 Hypotension, unspecified: Secondary | ICD-10-CM

## 2013-03-03 DIAGNOSIS — N151 Renal and perinephric abscess: Secondary | ICD-10-CM

## 2013-03-03 MED ORDER — SULFAMETHOXAZOLE-TMP DS 800-160 MG PO TABS: 1.0000 | ORAL_TABLET | Freq: Every day | ORAL | Status: DC

## 2013-03-03 MED ORDER — ELVITEG-COBIC-EMTRICIT-TENOFDF 150-150-200-300 MG PO TABS: 1.0000 | ORAL_TABLET | Freq: Every day | ORAL | Status: DC

## 2013-03-03 NOTE — Progress Notes (Signed)
   Subjective:    Patient ID: Jay Simon, male    DOB: Mar 27, 1990, 22 y.o.   MRN: [id number]  HPI This is a follow up of his recent hospitalizaion for pyelonephritis and HIV.  He has a history of HIV diagnosed in [place] in 2011 and at that time was started on Atripla and then Stribild when he went to another provider.  He was then in care for a short time and moved to [place] where he did not access care and has recently moved to [facility].  He plans on staying here permenantly.  He is applying for the drug assistance program through [employer].  History of syphilis.  Interested in therapy again and feels he will be able to take meds daily.     Review of Systems  Constitutional: Negative for fever and chills.  HENT: Negative for sore throat.   Respiratory: Negative for shortness of breath.   Gastrointestinal: Negative for diarrhea.  Endocrine: Negative for polyuria.  Genitourinary: Negative for genital sores.  Musculoskeletal: Negative for myalgias.  Skin: Negative for rash.  Neurological: Negative for dizziness and light-headedness.  Hematological: Negative for adenopathy.  Psychiatric/Behavioral: Negative for dysphoric mood.       Objective:   Physical Exam  Constitutional: He is oriented to person, place, and time. He appears well-developed and well-nourished. No distress.  HENT:  Mouth/Throat: No oropharyngeal exudate.  Eyes: Right eye exhibits no discharge. Left eye exhibits no discharge. No scleral icterus.  Cardiovascular: Normal rate, regular rhythm and normal heart sounds.   No murmur heard. Pulmonary/Chest: Effort normal and breath sounds normal. No respiratory distress. He has no wheezes.  Abdominal: Soft. Bowel sounds are normal. He exhibits no distension. There is no tenderness.  Lymphadenopathy:    He has no cervical adenopathy.  Neurological: He is alert and oriented to person, place, and time.  Skin: Skin is warm and dry. No rash noted.  Psychiatric: He has a normal mood and  affect.          Assessment & Plan:

## 2013-03-04 ENCOUNTER — Encounter: Payer: Self-pay | Admitting: Internal Medicine

## 2013-03-04 NOTE — Assessment & Plan Note (Signed)
resolved 

## 2013-03-04 NOTE — Assessment & Plan Note (Signed)
I discussed different treatment options, side effects, resistance.  He will start Stribild once ADAP approved.  He did get hep A and B in hospital.

## 2013-03-04 NOTE — Assessment & Plan Note (Signed)
He is currently asymptomatic and finishing his antibiotic.

## 2013-03-09 ENCOUNTER — Inpatient Hospital Stay: Payer: Self-pay | Admitting: Family Medicine

## 2013-03-09 ENCOUNTER — Telehealth: Payer: Self-pay | Admitting: *Deleted

## 2013-03-09 NOTE — Telephone Encounter (Signed)
Patient missed his Family Medicine hospital follow up appointment today.  RN gave Baird Lyons the number to reschedule [phone]).    Additionally the patient reports a rash in his groin.  He states it is "itching like crazy" and will be picking up benadryl cream tomorrow to use.  Patient has not changed his medications, is still on the bactrim and keflex.  Patient states there are no other symptoms.  Is not ADAP approved yet (should be sometime within the next 7 days).  Should he be seen for this rash in the mean time?  Please advise. Andree Coss, RN

## 2013-03-09 NOTE — Telephone Encounter (Signed)
He can be seen if he wants, He did not mention it at the visit.

## 2013-03-10 ENCOUNTER — Ambulatory Visit (INDEPENDENT_AMBULATORY_CARE_PROVIDER_SITE_OTHER): Payer: Self-pay | Admitting: Family Medicine

## 2013-03-10 ENCOUNTER — Ambulatory Visit (HOSPITAL_COMMUNITY)
Admission: RE | Admit: 2013-03-10 | Discharge: 2013-03-10 | Disposition: A | Discharge status: Home / Self Care | Payer: Medicaid Other | Source: Ambulatory Visit | Attending: Family Medicine | Admitting: Family Medicine

## 2013-03-10 ENCOUNTER — Encounter: Payer: Self-pay | Admitting: Family Medicine

## 2013-03-10 VITALS — BP 105/71 | HR 102 | Temp 98.6°F | Ht 69.0 in | Wt 148.0 lb

## 2013-03-10 DIAGNOSIS — R509 Fever, unspecified: Secondary | ICD-10-CM | POA: Insufficient documentation

## 2013-03-10 DIAGNOSIS — R05 Cough: Secondary | ICD-10-CM

## 2013-03-10 DIAGNOSIS — R112 Nausea with vomiting, unspecified: Secondary | ICD-10-CM

## 2013-03-10 DIAGNOSIS — R059 Cough, unspecified: Secondary | ICD-10-CM

## 2013-03-10 DIAGNOSIS — R21 Rash and other nonspecific skin eruption: Secondary | ICD-10-CM

## 2013-03-10 DIAGNOSIS — N12 Tubulo-interstitial nephritis, not specified as acute or chronic: Secondary | ICD-10-CM

## 2013-03-10 LAB — CBC WITH DIFFERENTIAL/PLATELET
Basophils Absolute: 0 10*3/uL (ref 0.0–0.1)
Basophils Relative: 1 % (ref 0–1)
Eosinophils Absolute: 0.2 10*3/uL (ref 0.0–0.7)
Eosinophils Relative: 5 % (ref 0–5)
HCT: 31.3 % — ABNORMAL LOW (ref 39.0–52.0)
Lymphocytes Relative: 37 % (ref 12–46)
Lymphs Abs: 1.9 10*3/uL (ref 0.7–4.0)
MCH: 30.4 pg (ref 26.0–34.0)
MCHC: 33.2 g/dL (ref 30.0–36.0)
MCV: 91.5 fL (ref 78.0–100.0)
Monocytes Absolute: 0.7 10*3/uL (ref 0.1–1.0)
Neutrophils Relative %: 44 % (ref 43–77)
Platelets: 251 10*3/uL (ref 150–400)
RBC: 3.42 MIL/uL — ABNORMAL LOW (ref 4.22–5.81)
RDW: 17.4 % — ABNORMAL HIGH (ref 11.5–15.5)
WBC: 5.3 10*3/uL (ref 4.0–10.5)

## 2013-03-10 LAB — POCT URINALYSIS DIPSTICK
Bilirubin, UA: NEGATIVE
Blood, UA: NEGATIVE
Glucose, UA: NEGATIVE
Ketones, UA: NEGATIVE
Leukocytes, UA: NEGATIVE
Protein, UA: NEGATIVE
Spec Grav, UA: >=1.03
Urobilinogen, UA: 0.2
pH, UA: 7

## 2013-03-10 LAB — BASIC METABOLIC PANEL
Calcium: 9.4 mg/dL (ref 8.4–10.5)
Creat: 0.69 mg/dL (ref 0.50–1.35)
Glucose, Bld: 87 mg/dL (ref 70–99)
Sodium: 137 mEq/L (ref 135–145)

## 2013-03-10 MED ORDER — PROMETHAZINE HCL 12.5 MG PO TABS: 12.5000 mg | ORAL_TABLET | Freq: Three times a day (TID) | ORAL | Status: DC | PRN

## 2013-03-10 MED ORDER — HYDROXYZINE HCL 25 MG PO TABS: 25.0000 mg | ORAL_TABLET | Freq: Three times a day (TID) | ORAL | Status: DC | PRN

## 2013-03-10 MED ORDER — DESONIDE 0.05 % EX CREA: TOPICAL_CREAM | Freq: Two times a day (BID) | CUTANEOUS | Status: DC

## 2013-03-10 MED ORDER — RANITIDINE HCL 150 MG PO TABS: 150.0000 mg | ORAL_TABLET | Freq: Two times a day (BID) | ORAL | Status: DC

## 2013-03-10 NOTE — Assessment & Plan Note (Signed)
Associated with fatigue and low grade temperature: 100.2 Check cxr to rule out pneumonia Check CBC to monitor WBC  UA normal Follow up tomorrow to monitor for fever or worsening symptoms.

## 2013-03-10 NOTE — Assessment & Plan Note (Signed)
UA today normal. Patient finished course of antibiotics

## 2013-03-10 NOTE — Assessment & Plan Note (Signed)
Rash around groin in immunocompromised patient: Folliculitis vs yeast vs molluscum Doesn't appear like yeast at this time.  Treat symptomatically with atarax for itching and desonide cream

## 2013-03-10 NOTE — Progress Notes (Signed)
Patient ID: Draeden Kellman    DOB: 04/11/1990, 22 y.o.   MRN: [id number] --- Subjective:  Arben is a 22 y.o.male with history of HIV (recent CD4 less than 100) who presents for followup from hospital admission for pyelonephritis/ concerns include the following - Cough and vomiting: Started having emesis 2 days ago. Vomiting after eating, twice yesterday nonbloody nonbilious emesis. Feels antacid from the food when this happens. Also has nausea. He has been able to drink sips of water. He has not eaten solid food without throwing up since yesterday. He denies any abdominal pain. He denies any diarrhea. He checked his temperature yesterday which was 100.2. He felt hot and cold all throughout the night last night. He started having a cough yesterday evening. Nonproductive. Associated with mild congestion. This morning he feels tired.  - He also reports having a rash around his groin. It started a day after he was discharged from the hospital. It itches. At first it started with itching skin. He then started developing bumps. He shaved the area to better take a look at it. He called the ID office reporting that S. and they recommended for him to take Benadryl and anti-itch cream which helped temporarily. They said that they would have to medicine for him but he still needs to be approved for Medicaid before he gets prescribed medicine. He denies any new partners. He is currently taking Bactrim. He finished his course of Keflex for his pyelonephritis. He is not currently on his antivirals as he is waiting for insurance to approve it.  - follow up on pyelo: Finish course of Keflex. He denies any back pain, dysuria, abdominal pain, he has been urinating fine.  ROS: see HPI Past Medical History: reviewed and updated medications and allergies. Social History: Tobacco: Former smoker  Objective: Filed Vitals:   03/10/13 0857  BP: 105/71  Pulse: 102  Temp: 98.6 F (37 C)    Physical Examination:    General appearance - alert, well appearing, and in no distress Nose - nasal turbinates are erythematous and congested bilaterally Mouth - mucous membranes moist, pharynx normal without lesions Neck - supple, no significant adenopathy Chest - normal work of breathing, mild crackles in the bases otherwise normal Heart - normal rate, regular rhythm, normal S1, S2, no murmurs Abdomen - soft, nontender, nondistended, no masses or organomegaly, no CVA tenderness Skin-groin area with adjacent punctate papules throughout groin area, pinkish color. No drainage, no crusting. No lesions on penis or shaft.  Rectal exam: anal tag present Fecal occult negative

## 2013-03-10 NOTE — Assessment & Plan Note (Signed)
Differential includes gastroenteritis vs candidal esophagitis vs GERD  UA checked and normal making recurrent pyelo unlikely.  With low CD4 count candidal esophagitis is possibility but difficult to say for sure.  With description of burning with vomiting, GERD is possible.  WIll symptomatically treat with phenergan and ranitidine.  Patient to follow up tomorrow to follow up on hydration status. He did not look overly dehydrated on exam but did have objective findings such as high specific gravity and mild tachycardia.

## 2013-03-10 NOTE — Telephone Encounter (Signed)
Patient addressed the rash at his PCP follow up visit today.

## 2013-03-10 NOTE — Patient Instructions (Signed)
I want to make sure that you do not have anything in your chest causing the cough. Let's get a chest xray.  For the itching of the groin, take the atarax and the desonide cream. If the rash gets worst with the cream, stop using it.  For the vomiting, keep fluids down. Take the phenergan for nausea and ranitidine for acid reflux.   Please follow up tomorrow.

## 2013-03-15 ENCOUNTER — Other Ambulatory Visit: Payer: Self-pay | Admitting: Licensed Clinical Social Worker

## 2013-03-15 DIAGNOSIS — B20 Human immunodeficiency virus [HIV] disease: Secondary | ICD-10-CM

## 2013-03-15 MED ORDER — ELVITEG-COBIC-EMTRICIT-TENOFDF 150-150-200-300 MG PO TABS: 1.0000 | ORAL_TABLET | Freq: Every day | ORAL | Status: DC

## 2013-03-21 ENCOUNTER — Telehealth: Payer: Self-pay | Admitting: *Deleted

## 2013-03-21 NOTE — Telephone Encounter (Signed)
Called and left patient a message with his emergency contact to call the clinic. His phone is not in service. Trying to schedule him for an HRA appointment for 04/01/13. Wendall Mola

## 2013-04-19 ENCOUNTER — Ambulatory Visit (INDEPENDENT_AMBULATORY_CARE_PROVIDER_SITE_OTHER): Payer: Self-pay | Admitting: Internal Medicine

## 2013-04-19 ENCOUNTER — Encounter: Payer: Self-pay | Admitting: Internal Medicine

## 2013-04-19 VITALS — BP 118/80 | HR 76 | Temp 97.6°F | Wt 151.0 lb

## 2013-04-19 DIAGNOSIS — B20 Human immunodeficiency virus [HIV] disease: Secondary | ICD-10-CM

## 2013-04-19 DIAGNOSIS — R112 Nausea with vomiting, unspecified: Secondary | ICD-10-CM

## 2013-04-19 LAB — CBC WITH DIFFERENTIAL/PLATELET
Basophils Absolute: 0 10*3/uL (ref 0.0–0.1)
Basophils Relative: 0 % (ref 0–1)
EOS PCT: 4 % (ref 0–5)
Eosinophils Absolute: 0.2 10*3/uL (ref 0.0–0.7)
HCT: 36.3 % — ABNORMAL LOW (ref 39.0–52.0)
HEMOGLOBIN: 12.2 g/dL — AB (ref 13.0–17.0)
LYMPHS ABS: 1.9 10*3/uL (ref 0.7–4.0)
Lymphocytes Relative: 43 % (ref 12–46)
MCH: 30.8 pg (ref 26.0–34.0)
MCHC: 33.6 g/dL (ref 30.0–36.0)
MCV: 91.7 fL (ref 78.0–100.0)
MONOS PCT: 14 % — AB (ref 3–12)
Monocytes Absolute: 0.6 10*3/uL (ref 0.1–1.0)
Neutro Abs: 1.7 10*3/uL (ref 1.7–7.7)
Neutrophils Relative %: 39 % — ABNORMAL LOW (ref 43–77)
Platelets: 242 10*3/uL (ref 150–400)
RBC: 3.96 MIL/uL — AB (ref 4.22–5.81)
RDW: 14.6 % (ref 11.5–15.5)
WBC: 4.5 10*3/uL (ref 4.0–10.5)

## 2013-04-19 LAB — COMPLETE METABOLIC PANEL WITH GFR
ALT: 36 U/L (ref 0–53)
AST: 43 U/L — ABNORMAL HIGH (ref 0–37)
Albumin: 3.6 g/dL (ref 3.5–5.2)
Alkaline Phosphatase: 70 U/L (ref 39–117)
BILIRUBIN TOTAL: 0.4 mg/dL (ref 0.3–1.2)
BUN: 9 mg/dL (ref 6–23)
CO2: 28 mEq/L (ref 19–32)
CREATININE: 0.7 mg/dL (ref 0.50–1.35)
Calcium: 8.9 mg/dL (ref 8.4–10.5)
Chloride: 104 mEq/L (ref 96–112)
GLUCOSE: 83 mg/dL (ref 70–99)
Potassium: 3.8 mEq/L (ref 3.5–5.3)
SODIUM: 137 meq/L (ref 135–145)
TOTAL PROTEIN: 8.2 g/dL (ref 6.0–8.3)

## 2013-04-19 MED ORDER — SULFAMETHOXAZOLE-TMP DS 800-160 MG PO TABS: 1.0000 | ORAL_TABLET | Freq: Every day | ORAL | Status: DC

## 2013-04-19 MED ORDER — ELVITEG-COBIC-EMTRICIT-TENOFDF 150-150-200-300 MG PO TABS: 1.0000 | ORAL_TABLET | Freq: Every day | ORAL | Status: DC

## 2013-04-19 NOTE — Progress Notes (Signed)
   Subjective:    Patient ID: Jay SandhoffChristian Therrell, male    DOB: 01/19/1991, 23 y.o.   MRN: [id number]  HPI  This is a follow up of his HIV.  He has a history of HIV diagnosed in [place] in 2011 and at that time was started on Atripla and then Stribild when he went to another provider.  He was then in care for a short time and moved to [place] where [place] not access care and [place] recently [place] [facility]  He plans on staying here permenantly.  He started Stribild about 1 month ago and is pleased with the regimen.  Some nausea initially but none now.  Feels well.  Some itching for a couple of days but resolved.  On Bactrim prophylaxis as well.     Review of Systems  Constitutional: Negative for fever and chills.  HENT: Negative for sore throat.   Respiratory: Negative for shortness of breath.   Gastrointestinal: Negative for diarrhea.  Endocrine: Negative for polyuria.  Genitourinary: Negative for genital sores.  Musculoskeletal: Negative for myalgias.  Skin: Negative for rash.  Neurological: Negative for dizziness and light-headedness.  Hematological: Negative for adenopathy.  Psychiatric/Behavioral: Negative for dysphoric mood.       Objective:   Physical Exam  Constitutional: He is oriented to person, place, and time. He appears well-developed and well-nourished. No distress.  HENT:  Mouth/Throat: No oropharyngeal exudate.  Eyes: Right eye exhibits no discharge. Left eye exhibits no discharge. No scleral icterus.  Cardiovascular: Normal rate, regular rhythm and normal heart sounds.   No murmur heard. Pulmonary/Chest: Effort normal and breath sounds normal. No respiratory distress. He has no wheezes.  Abdominal: Soft. Bowel sounds are normal. He exhibits no distension. There is no tenderness.  Lymphadenopathy:    He has no cervical adenopathy.  Neurological: He is alert and oriented to person, place, and time.  Skin: Skin is warm and dry. No rash noted.  Psychiatric: He has a normal mood and  affect.          Assessment & Plan:

## 2013-04-19 NOTE — Assessment & Plan Note (Signed)
resolved 

## 2013-04-19 NOTE — Assessment & Plan Note (Signed)
Will check labs today.  RTC 2 months, sooner if indicated by labs.

## 2013-04-20 LAB — HIV-1 RNA QUANT-NO REFLEX-BLD
HIV 1 RNA Quant: 346 copies/mL — ABNORMAL HIGH (ref <20)
HIV-1 RNA Quant, Log: 2.54 {Log} — ABNORMAL HIGH (ref <1.30)

## 2013-04-21 LAB — T-HELPER CELL (CD4) - (RCID CLINIC ONLY)
CD4 % Helper T Cell: 17 % — ABNORMAL LOW (ref 33–55)
CD4 T Cell Abs: 320 /uL — ABNORMAL LOW (ref 400–2700)

## 2013-04-29 ENCOUNTER — Encounter: Payer: Self-pay | Admitting: *Deleted

## 2013-05-23 ENCOUNTER — Telehealth: Payer: Self-pay | Admitting: *Deleted

## 2013-05-23 NOTE — Telephone Encounter (Signed)
Swollen area behind nipple which seems to be getting bigger and "itchy red dots" on chest.  Requesting appt.

## 2013-05-24 ENCOUNTER — Ambulatory Visit (INDEPENDENT_AMBULATORY_CARE_PROVIDER_SITE_OTHER): Payer: Self-pay | Admitting: Internal Medicine

## 2013-05-24 VITALS — BP 134/84 | HR 79 | Temp 98.2°F | Wt 144.0 lb

## 2013-05-24 DIAGNOSIS — B1920 Unspecified viral hepatitis C without hepatic coma: Secondary | ICD-10-CM

## 2013-05-24 DIAGNOSIS — B20 Human immunodeficiency virus [HIV] disease: Secondary | ICD-10-CM

## 2013-05-24 NOTE — Progress Notes (Signed)
   Subjective:    Patient ID: Jay Simon, male    DOB: 05/17/1990, 23 y.o.   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 Misses 2 doses/month but takes it late, roughly 12 hrs. He noticed in the last 4 wks notice having breast tenderness bilaterally intermittently. No leakage. New problem, never happened before.  Has a partner,who also takes stribild, helps reminding him  Current Outpatient Prescriptions on File Prior to Visit  Medication Sig Dispense Refill  . desonide (DESOWEN) 0.05 % cream Apply topically 2 (two) times daily.  30 g  0  . elvitegravir-cobicistat-emtricitabine-tenofovir (STRIBILD) 150-150-200-300 MG TABS tablet Take 1 tablet by mouth daily.  30 tablet  5  . hydrOXYzine (ATARAX/VISTARIL) 25 MG tablet Take 1 tablet (25 mg total) by mouth 3 (three) times daily as needed for itching.  30 tablet  0  . OVER THE COUNTER MEDICATION Take 2 tablets by mouth daily as needed (pain). [employer]      . promethazine (PHENERGAN) 12.5 MG tablet Take 1 tablet (12.5 mg total) by mouth every 8 (eight) hours as needed for nausea or vomiting.  20 tablet  0  . ranitidine (ZANTAC) 150 MG tablet Take 1 tablet (150 mg total) by mouth 2 (two) times daily.  30 tablet  0  . sulfamethoxazole-trimethoprim (BACTRIM DS) 800-160 MG per tablet Take 1 tablet by mouth daily.  30 tablet  5   No current facility-administered medications on file prior to visit.     Review of Systems     Objective:   Physical Exam BP 134/84  Pulse 79  Temp(Src) 98.2 F (36.8 C) (Oral)  Wt 144 lb (65.318 kg)  Physical Exam  Constitutional: He is oriented to person, place, and time. He appears well-developed and well-nourished. No distress.  HENT:  Mouth/Throat: Oropharynx is clear and moist. No oropharyngeal exudate.  Cardiovascular: Normal rate, regular rhythm and normal heart sounds. Exam reveals no gallop and no  friction rub.  No murmur heard.  Pulmonary/Chest: Effort normal and breath sounds normal. No respiratory distress. He has no wheezes.  Abdominal: Soft. Bowel sounds are normal. He exhibits no distension. There is no tenderness.  Lymphadenopathy:  He has no cervical adenopathy.  Neurological: He is alert and oriented to person, place, and time.  Skin: Skin is warm and dry. No rash noted. No erythema.  Psychiatric: He has a normal mood and affect. His behavior is normal.         Assessment & Plan:  hiv = continue with stribild, will check VL in 4 wks to ensure he had good virologic control after 3 months of therapy  Breast tenderness = could be due to HAART vs. Elevated prolactin or TSH. Will monitor over the next 4 wk and check in for further testing  HCV = will see if ADAP covers fibrosure/scan to discuss when to start HCV treatment. APRI score 0.48, which would suggest low likelihood of fibrosis  Health maintenance =will give hep B#2, slightly off schedule  rtc 4 wks

## 2013-05-27 ENCOUNTER — Telehealth: Payer: Self-pay | Admitting: *Deleted

## 2013-05-27 NOTE — Telephone Encounter (Signed)
Called patient and left a voice mail for him to call back regarding the HRA clinic scheduled for 06/10/13. 9:30 AM is available now but he would need to confirm. Jay MuirJamie would add to schedule. Jay MolaJacqueline Jakylah Bassinger

## 2013-06-10 ENCOUNTER — Ambulatory Visit: Payer: Self-pay | Admitting: Infectious Diseases

## 2013-06-28 ENCOUNTER — Telehealth: Payer: Self-pay | Admitting: *Deleted

## 2013-06-28 ENCOUNTER — Ambulatory Visit: Payer: Self-pay | Admitting: Internal Medicine

## 2013-06-28 NOTE — Telephone Encounter (Signed)
Called patient about missed appt and had to leave a message for him to call the office and reschedule asap.

## 2013-08-09 ENCOUNTER — Ambulatory Visit (INDEPENDENT_AMBULATORY_CARE_PROVIDER_SITE_OTHER): Payer: Self-pay | Admitting: Internal Medicine

## 2013-08-09 ENCOUNTER — Encounter: Payer: Self-pay | Admitting: Internal Medicine

## 2013-08-09 ENCOUNTER — Other Ambulatory Visit: Payer: Self-pay | Admitting: Internal Medicine

## 2013-08-09 VITALS — BP 124/70 | HR 66 | Temp 97.1°F | Wt 150.0 lb

## 2013-08-09 DIAGNOSIS — L299 Pruritus, unspecified: Secondary | ICD-10-CM

## 2013-08-09 DIAGNOSIS — B20 Human immunodeficiency virus [HIV] disease: Secondary | ICD-10-CM

## 2013-08-09 LAB — COMPLETE METABOLIC PANEL WITH GFR
ALT: 80 U/L — ABNORMAL HIGH (ref 0–53)
AST: 49 U/L — AB (ref 0–37)
Albumin: 4.1 g/dL (ref 3.5–5.2)
Alkaline Phosphatase: 91 U/L (ref 39–117)
BUN: 11 mg/dL (ref 6–23)
CO2: 30 mEq/L (ref 19–32)
CREATININE: 0.78 mg/dL (ref 0.50–1.35)
Calcium: 9.4 mg/dL (ref 8.4–10.5)
Chloride: 102 mEq/L (ref 96–112)
GFR, Est African American: >89 mL/min
GFR, Est Non African American: >89 mL/min
Glucose, Bld: 87 mg/dL (ref 70–99)
Potassium: 3.7 mEq/L (ref 3.5–5.3)
Sodium: 136 mEq/L (ref 135–145)
Total Bilirubin: 0.7 mg/dL (ref 0.2–1.2)
Total Protein: 8.7 g/dL — ABNORMAL HIGH (ref 6.0–8.3)

## 2013-08-09 LAB — CBC WITH DIFFERENTIAL/PLATELET
BASOS ABS: 0 10*3/uL (ref 0.0–0.1)
Basophils Relative: 1 % (ref 0–1)
EOS ABS: 0.1 10*3/uL (ref 0.0–0.7)
EOS PCT: 3 % (ref 0–5)
HCT: 39.8 % (ref 39.0–52.0)
Hemoglobin: 13.5 g/dL (ref 13.0–17.0)
LYMPHS PCT: 41 % (ref 12–46)
Lymphs Abs: 2 10*3/uL (ref 0.7–4.0)
MCH: 31.6 pg (ref 26.0–34.0)
MCHC: 33.9 g/dL (ref 30.0–36.0)
MCV: 93.2 fL (ref 78.0–100.0)
Monocytes Absolute: 0.6 10*3/uL (ref 0.1–1.0)
Monocytes Relative: 12 % (ref 3–12)
Neutro Abs: 2.1 10*3/uL (ref 1.7–7.7)
Neutrophils Relative %: 43 % (ref 43–77)
PLATELETS: 213 10*3/uL (ref 150–400)
RBC: 4.27 MIL/uL (ref 4.22–5.81)
RDW: 13.8 % (ref 11.5–15.5)
WBC: 4.9 10*3/uL (ref 4.0–10.5)

## 2013-08-09 MED ORDER — HYDROXYZINE HCL 25 MG PO TABS
25.0000 mg | ORAL_TABLET | Freq: Three times a day (TID) | ORAL | Status: DC | PRN
Start: 2013-08-09 — End: 2013-12-10

## 2013-08-09 NOTE — Progress Notes (Signed)
Subjective:    Patient ID: Jay Simon, male    DOB: 04/10/1990, 23 y.o.   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 He reports taking his meds routinely but notices occasionally pruritis.he takes food inconsistently with his meds. He has taken benadryl but too sedating.  Roughly 1 month ago, he had rectal bleeding associated with hemorrhoids but now resolved after 2 days of symptoms. He has had seasonal allergies and treated with allegra. Otherwise, doing ok today. He denies any further issues with breast tenderness.  Current Outpatient Prescriptions on File Prior to Visit  Medication Sig Dispense Refill  . desonide (DESOWEN) 0.05 % cream Apply topically 2 (two) times daily.  30 g  0  . elvitegravir-cobicistat-emtricitabine-tenofovir (STRIBILD) 150-150-200-300 MG TABS tablet Take 1 tablet by mouth daily.  30 tablet  5  . OVER THE COUNTER MEDICATION Take 2 tablets by mouth daily as needed (pain). [facility]      . promethazine (PHENERGAN) 12.5 MG tablet Take 1 tablet (12.5 mg total) by mouth every 8 (eight) hours as needed for nausea or vomiting.  20 tablet  0  . ranitidine (ZANTAC) 150 MG tablet Take 1 tablet (150 mg total) by mouth 2 (two) times daily.  30 tablet  0  . sulfamethoxazole-trimethoprim (BACTRIM DS) 800-160 MG per tablet Take 1 tablet by mouth daily.  30 tablet  5   No current facility-administered medications on file prior to visit.   Active Ambulatory Problems    Diagnosis Date Noted  . HIV disease 02/21/2013  . 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 Renal abscess, right 02/22/2013  . Protein-calorie malnutrition, severe 02/22/2013  . Rash and nonspecific skin eruption 03/10/2013  . Cough 03/10/2013   Resolved Ambulatory Problems    Diagnosis Date Noted  . Pyelonephritis 02/20/2013  . Hypotension, unspecified 02/21/2013  . Sepsis(995.91) 02/21/2013  . Nausea with vomiting  03/10/2013   Past Medical History  Diagnosis Date  . Syphilis 2014  . Anal condyloma        Review of Systems 10 ros is negative except what is mentioned above    Objective:   Physical Exam BP 124/70  Pulse 66  Temp(Src) 97.1 F (36.2 C) (Oral)  Wt 150 lb (68.04 kg) Physical Exam  Constitutional: He is oriented to person, place, and time. He appears well-developed and well-nourished. No distress.  HENT:  Mouth/Throat: Oropharynx is clear and moist. No oropharyngeal exudate.  Cardiovascular: Normal rate, regular rhythm and normal heart sounds. Exam reveals no gallop and no friction rub.  No murmur heard.  Pulmonary/Chest: Effort normal and breath sounds normal. No respiratory distress. He has no wheezes.  Abdominal: Soft. Bowel sounds are normal. He exhibits no distension. There is no tenderness.  Lymphadenopathy:  He has no cervical adenopathy. Shotty bilateral axillary LAD, mobile, nontender Neurological: He is alert and oriented to person, place, and time.  Skin: Skin is warm and dry. No rash noted. No erythema.  Psychiatric: He has a normal mood and affect. His behavior is normal.          Assessment & Plan:  hiv = has low level viremia at last lab work. Will repeat labs. Spent 15 min discussing adherence and to take meds with food  Pruritis = will try trial of atarax  hcv = will check hep b s ab to see if needs hep b series re initiated. We will  apply for Patient Assistance to get medications. Patient is aware that we will need monthly visits x 3 will on meds.  Rectal bleeding = likely 2/2 to hemorrhoids. No longer an issue  rtc in 6 wk ( likely to have been on treatment for 1 month if hcv application goes in this week)

## 2013-08-10 LAB — HIV-1 RNA QUANT-NO REFLEX-BLD
HIV 1 RNA QUANT: 38650 {copies}/mL — AB (ref <20)
HIV-1 RNA QUANT, LOG: 4.59 {Log} — AB (ref <1.30)

## 2013-08-10 LAB — T-HELPER CELL (CD4) - (RCID CLINIC ONLY)
CD4 % Helper T Cell: 15 % — ABNORMAL LOW (ref 33–55)
CD4 T CELL ABS: 300 /uL — AB (ref 400–2700)

## 2013-08-10 LAB — HEPATITIS B SURFACE ANTIBODY,QUALITATIVE: HEP B S AB: POSITIVE — AB

## 2013-08-16 ENCOUNTER — Other Ambulatory Visit: Payer: Self-pay | Admitting: Internal Medicine

## 2013-08-16 ENCOUNTER — Telehealth: Payer: Self-pay | Admitting: *Deleted

## 2013-08-16 DIAGNOSIS — B20 Human immunodeficiency virus [HIV] disease: Secondary | ICD-10-CM

## 2013-08-16 NOTE — Telephone Encounter (Signed)
Genotype added, patient notified and appointment scheduled for 08/23/13. Wendall Mola

## 2013-08-16 NOTE — Telephone Encounter (Signed)
Message copied by Macy Mis on Tue Aug 16, 2013  9:50 AM ------      Message from: Gardiner Barefoot      Created: Tue Aug 16, 2013  9:13 AM       Can you add on a genotype and he needs to stop Stribild due to resistance and come in next week.  thanks ------

## 2013-08-23 ENCOUNTER — Ambulatory Visit: Payer: Self-pay | Admitting: Internal Medicine

## 2013-08-27 LAB — HIV-1 GENOTYPR PLUS

## 2013-11-02 ENCOUNTER — Telehealth: Payer: Self-pay

## 2013-11-02 NOTE — Telephone Encounter (Signed)
[facility].   Appointment given.   I will need proof of positivity.   Contact number :  [phone]

## 2013-11-15 ENCOUNTER — Ambulatory Visit: Payer: Medicaid Other

## 2013-11-17 ENCOUNTER — Telehealth: Payer: Self-pay | Admitting: *Deleted

## 2013-11-17 NOTE — Telephone Encounter (Signed)
Received call from Shirline Frees, RN at [facility].  Patient is currently incarcerated. This RN received signed ROI.  They are requesting most recent labs, office visit, MD recommendations.  Per Dr. Luciana Axe, patient should continue daily Stribild and Bactrim DS.   Per Freda Jackson, patient could be released within a month.  Patient will need to schedule follow up once he has been released. Will fax requested records to [phone]. Andree Coss, RN

## 2013-12-10 ENCOUNTER — Encounter (HOSPITAL_COMMUNITY): Payer: Self-pay | Admitting: Emergency Medicine

## 2013-12-10 ENCOUNTER — Emergency Department (HOSPITAL_COMMUNITY)
Admission: EM | Admit: 2013-12-10 | Discharge: 2013-12-11 | Disposition: A | Discharge status: Home / Self Care | Payer: Medicaid Other | Attending: Emergency Medicine | Admitting: Emergency Medicine

## 2013-12-10 DIAGNOSIS — F3289 Other specified depressive episodes: Secondary | ICD-10-CM | POA: Diagnosis not present

## 2013-12-10 DIAGNOSIS — F329 Major depressive disorder, single episode, unspecified: Secondary | ICD-10-CM | POA: Insufficient documentation

## 2013-12-10 DIAGNOSIS — Z87891 Personal history of nicotine dependence: Secondary | ICD-10-CM | POA: Diagnosis not present

## 2013-12-10 DIAGNOSIS — Z21 Asymptomatic human immunodeficiency virus [HIV] infection status: Secondary | ICD-10-CM | POA: Diagnosis not present

## 2013-12-10 DIAGNOSIS — F4321 Adjustment disorder with depressed mood: Secondary | ICD-10-CM

## 2013-12-10 DIAGNOSIS — F411 Generalized anxiety disorder: Secondary | ICD-10-CM | POA: Insufficient documentation

## 2013-12-10 DIAGNOSIS — Z8619 Personal history of other infectious and parasitic diseases: Secondary | ICD-10-CM | POA: Diagnosis not present

## 2013-12-10 NOTE — ED Notes (Signed)
Pt presents with police with c/o attempt to hurt himself. Pt reports he has a hx of anxiety and he called 911 because he was having some "concerning thoughts" that he would like to talk it out with someone. When police arrived at his house, he was holding a knife to his chest. Pt is currently under emergency commitment by police. Pt is anxious at this time and does not want to be here but is still cooperative right now. Pt recently found out he is HIV positive and reports that the thoughts he was having were related to his diagnosis and how miserable he feels about his situation. Denies SI/HI at this time. Pt also has some ETOH on board.

## 2013-12-10 NOTE — ED Provider Notes (Signed)
CSN: [id number]     Arrival date & time 12/10/13  2216 History   First MD Initiated Contact with Patient 12/10/13 2334   This chart was scribed for non-physician practitioner Cerise Lieber Camprubi- Soms, PA-C, working with Lyanne Co, MD by Gwenevere Abbot, ED scribe. This patient was seen in room WTR2/WLPT2 and the patient's care was started at 11:37 PM.    Chief Complaint  Patient presents with  . Anxiety    Patient is a 23 y.o. male presenting with anxiety. The history is provided by the patient. No language interpreter was used.  Anxiety This is a new problem. The current episode started 1 to 2 hours ago. The problem occurs intermittently. The problem has been resolved. Pertinent negatives include no chest pain, no abdominal pain, no headaches and no shortness of breath. The symptoms are aggravated by stress. He has tried nothing for the symptoms.  Anxiety This is a new problem. The current episode started 1 to 2 hours ago. The problem occurs intermittently. The problem has been resolved. Pertinent negatives include no abdominal pain, chest pain, diaphoresis, fever or headaches. The symptoms are aggravated by stress. He has tried nothing for the symptoms.   HPI Comments:  Jay Simon is a 23 y.o. male with a recent dx of HIV, who presents to the Emergency Department complaining of anxiety. Pt reports that he was recently diagnosed with HIV. Pt reports that he has had a hard time keeping up with his medications and things have been very emotional recently.  Pt reports tonight he and his partner became involved in a verbal altercation regarding his medications, and that he felt overwhelmed so he called a help line and 911 to talk to somone. Pt reports that when he called EMS he expected an ambulance to arrive to help him, however squad cars arrived to the scene first and he became scared because they all had guns pointed at him.  Pt reports that he did have a knife because he thought that if he  had the knife, the police would not come near him, and he was just very scared. Pt reports that he felt very overwhelmed and anxious at this point.  Pt denies HI or SI, and was not trying to hurt himself with the knife. Pt denies auditory or visual hallucinations. Pt reports that his mother has depression, but he has not dealt with depression before, and states he just feels like he's down but not truly depressed. Pt rpeorts that he had 3 shots, and 1 beer with dinner at approximately 9 PM.  Pt denies any medical complaints. Pt reports that he chews tobacco. Pt denies other medical issues aside from HIV. Denies drug use.  Past Medical History  Diagnosis Date  . HIV disease 2011  . Syphilis 2014  . Anal condyloma    Past Surgical History  Procedure Laterality Date  . Colon surgery     Family History  Problem Relation Age of Onset  . Diabetes Mother   . Diabetes Maternal Grandmother   . Diabetes Maternal Grandfather   . Cancer Maternal Grandmother     breast   History  Substance Use Topics  . Smoking status: Former Smoker    Types: Cigarettes  . Smokeless tobacco: Never Used  . Alcohol Use: Yes     Comment: occasionally    Review of Systems  Constitutional: Negative for fever and diaphoresis.  Respiratory: Negative for shortness of breath.   Cardiovascular: Negative for chest pain.  Gastrointestinal:  Negative for abdominal pain.  Neurological: Negative for headaches.  Psychiatric/Behavioral: Negative for suicidal ideas and self-injury. The patient is nervous/anxious.     10 Systems reviewed and are negative for acute change except as noted in the HPI.   Allergies  Risperdal and Shellfish allergy  Home Medications   Prior to Admission medications   Not on File   BP 120/91  Pulse 112  Temp(Src) 98.6 F (37 C) (Oral)  Resp 18  SpO2 100% Physical Exam  Nursing note and vitals reviewed. Constitutional: He is oriented to person, place, and time. Vital signs are  normal. He appears well-developed and well-nourished. No distress.  Calm and cooperative, pleasant  HENT:  Head: Normocephalic and atraumatic.  Mouth/Throat: Mucous membranes are normal.  Eyes: EOM are normal.  Neck: Normal range of motion. Neck supple.  Cardiovascular: Normal rate, regular rhythm, normal heart sounds and intact distal pulses.  Exam reveals no gallop and no friction rub.   No murmur heard. Pulmonary/Chest: Effort normal and breath sounds normal. No respiratory distress. He has no decreased breath sounds. He has no wheezes. He has no rhonchi. He has no rales.  Abdominal: Soft. Normal appearance and bowel sounds are normal. He exhibits no distension. There is no tenderness. There is no rigidity, no rebound, no guarding, no CVA tenderness, no tenderness at McBurney's point and negative Murphy's sign.  Musculoskeletal: Normal range of motion.  Neurological: He is alert and oriented to person, place, and time. He has normal strength. No sensory deficit.  Skin: Skin is warm, dry and intact.  Psychiatric: He has a normal mood and affect. His behavior is normal. Thought content normal. He is not actively hallucinating.  Does not appear anxious or depressed, very pleasant and cooperative. Denies self injury or SI/HI/AVH    ED Course  Procedures  DIAGNOSTIC STUDIES: Oxygen Saturation is 100% on RA, normal by my interpretation.  COORDINATION OF CARE: 11:42 PM-Discussed treatment plan which includes outpt behavioral health management with pt at bedside and pt agreed to plan.  Labs Review Labs Reviewed - No data to display  Imaging Review No results found.   EKG Interpretation None      MDM   Final diagnoses:  Anxiety state  Situational depression    23y/o male with no SI or plans, has recent HIV dx and got scared tonight when cops showed up with guns. Don't believe pt is a threat to himself or others, and pt does not want behavioral health eval because he's not  feeling suicidal or having self injurous thoughts. Has good support as outpt, has been using counseling services and helplines, do not feel he needs to be IVC'd. Very low risk for any imminent psychiatric issues, and do not feel he needs inpatient treatment given that he is not suicidal. His EtOH was several hrs ago and he's clinically sober at this time. Will give resource guide and have him f/up with behavioral team as outpt. Encouraged him to come back for SI/HI/AVH. Pt safe for d/c at this time.  I personally performed the services described in this documentation, which was scribed in my presence. The recorded information has been reviewed and is accurate.  BP 120/91  Pulse 112  Temp(Src) 98.6 F (37 C) (Oral)  Resp 18  SpO2 100%    Selen Smucker Strupp Camprubi-Soms, PA-C 12/11/13 0040

## 2013-12-11 NOTE — Discharge Instructions (Signed)
Use the resource guide below to find a behavioral health provider to help with the feelings you're having related to your new diagnosis. See your regular doctor this week for follow up. Return to the ER for changes or worsening symptoms   Adjustment Disorder Most changes in life can cause stress. Getting used to changes may take a few months or longer. If feelings of stress, hopelessness, or worry continue, you may have an adjustment disorder. This stress-related mental health problem may affect your feelings, thinking and how you act. It occurs in both sexes and happens at any age. SYMPTOMS  Some of the following problems may be seen and vary from person to person:  Sadness or depression.  Loss of enjoyment.  Thoughts of suicide.  Fighting.  Avoiding family and friends.  Poor school performance.  Hopelessness, sense of loss.  Trouble sleeping.  Vandalism.  Worry, weight loss or gain.  Crying spells.  Anxiety  Reckless driving.  Skipping school.  Poor work International aid/development worker.  Nervousness.  Ignoring bills.  Poor attitude. DIAGNOSIS  Your caregiver will ask what has happened in your life and do a physical exam. They will make a diagnosis of an adjustment disorder when they are sure another problem or medical illness causing your feelings does not exist. TREATMENT  When problems caused by stress interfere with you daily life or last longer than a few months, you may need counseling for an adjustment disorder. Early treatment may diminish problems and help you to better cope with the stressful events in your life. Sometimes medication is necessary. Individual counseling and or support groups can be very helpful. PROGNOSIS  Adjustment disorders usually last less than 3 to 6 months. The condition may persist if there is long lasting stress. This could include health problems, relationship problems, or job difficulties where you can not easily escape from what is causing the  problem. PREVENTION  Even the most mentally healthy, highly functioning people can suffer from an adjustment disorder given a significant blow from a life-changing event. There is no way to prevent pain and loss. Most people need help from time to time. You are not alone. SEEK MEDICAL CARE IF:  Your feelings or symptoms listed above do not improve or worsen. Document Released: 11/12/2005 Document Revised: 06/02/2011 Document Reviewed: 02/03/2007 [facility] Patient Information 2015 [place], [place]. This information is not intended to replace advice given to you by your health care provider. Make sure you discuss any questions you have with your health care provider. Behavioral Health Resources in the [facility]  Intensive Outpatient Programs: [facility]      [address] [place], [place] [phone] Both a day and evening program       [facility]     [address]        [place], [place] [address] [phone]         [facility]: [facility] [address] [place] [phone]  [facility] Mental Health ACCESS LINE: [phone] or [phone] [address] [place], [place] [address] [email or web address]  Mobile Crisis Teams:                                        [facility]         Mobile Crisis Care Unit [phone]  [facility] [address] [place] [phone]                                         [facility] [address] [place] [phone]  Self-Help/Support Groups: [facility] [employer] of support groups [phone] (call for more info)  Narcotics Anonymous (NA) [facility] [address] [place] - 2 meetings at this location  Residential Treatment Programs:  [facility]      [address]          [place]       [phone]         [facility] [address], [place] [address] [place], [place]  [address] [phone]  [facility]  [address] [place], [place] [address] [phone] Admissions: 8am-3pm M-F  [facility]     [address]        [place], [place] [address]       [phone]         [facility] [address] [place], [place] [phone]  [facility] [address] [place], [place] [phone]  [facility] - Intensive Outpatient      [address]     [place], [place]       [phone]         [facility] ([facility]     [address] [place], [place] [phone] or [phone]  [facility] ([facility])  [address] [place], [place] [phone]  [facility] [address]                                               [address] [place] [phone]  [facility] Resources: [place] [phone]               General Therapy                                                Angie Fava, PhD        [address] [place], [place] [address]         [phone]   Insurance  [facility]   [address] [place], [place] [address] [phone]  [facility] [address] [place], [place] [address] [phone] Insurance/Medicaid/sponsorship through [facility]  [facility]                                              [phone]  [place], [place] [address]    Therapy/tele-psych/case         [phone]          [facility] [address], [place]  [address]  Adolescent/group home/case management [phone]                                           Creola Corn PhD       General therapy       Insurance   [phone]         Dr. Lolly Mustache Insurance [phone] M-F  [facility] Medicaid, sponsorship [phone]

## 2013-12-11 NOTE — ED Provider Notes (Signed)
Medical screening examination/treatment/procedure(s) were performed by non-physician practitioner and as supervising physician I was immediately available for consultation/collaboration.   EKG Interpretation None        Lyanne Co, MD 12/11/13 [phone]

## 2013-12-12 ENCOUNTER — Emergency Department (HOSPITAL_COMMUNITY)
Admission: EM | Admit: 2013-12-12 | Discharge: 2013-12-13 | Disposition: A | Discharge status: Home / Self Care | Payer: Medicaid Other | Attending: Emergency Medicine | Admitting: Emergency Medicine

## 2013-12-12 ENCOUNTER — Encounter (HOSPITAL_COMMUNITY): Payer: Self-pay | Admitting: Emergency Medicine

## 2013-12-12 DIAGNOSIS — Z21 Asymptomatic human immunodeficiency virus [HIV] infection status: Secondary | ICD-10-CM | POA: Insufficient documentation

## 2013-12-12 DIAGNOSIS — F1510 Other stimulant abuse, uncomplicated: Secondary | ICD-10-CM | POA: Insufficient documentation

## 2013-12-12 DIAGNOSIS — Z87891 Personal history of nicotine dependence: Secondary | ICD-10-CM | POA: Insufficient documentation

## 2013-12-12 DIAGNOSIS — Z046 Encounter for general psychiatric examination, requested by authority: Secondary | ICD-10-CM | POA: Diagnosis present

## 2013-12-12 DIAGNOSIS — F4325 Adjustment disorder with mixed disturbance of emotions and conduct: Secondary | ICD-10-CM | POA: Diagnosis not present

## 2013-12-12 DIAGNOSIS — F4329 Adjustment disorder with other symptoms: Secondary | ICD-10-CM

## 2013-12-12 DIAGNOSIS — R45851 Suicidal ideations: Secondary | ICD-10-CM | POA: Diagnosis not present

## 2013-12-12 DIAGNOSIS — Z8619 Personal history of other infectious and parasitic diseases: Secondary | ICD-10-CM | POA: Insufficient documentation

## 2013-12-12 NOTE — ED Notes (Signed)
Pt called [employer] for suicidal thoughts, they went and talked to him and then he was fine and wanted to go to jail because his world was falling apart, minutes after they left he called back and stated that he wanted to, [employer] went back out and he had a knife to his throat and they had to tase him to get him to drop the knife, pt was telling dispatch that he wanted the police to shoot him.

## 2013-12-12 NOTE — ED Provider Notes (Addendum)
CSN: [id number]     Arrival date & time 12/12/13  2346 History   None    Chief Complaint  Patient presents with  . Suicidal     (Consider location/radiation/quality/duration/timing/severity/associated sxs/prior Treatment) HPI Comments: Pt called [employer] for suicidal thoughts, they went and talked to him and then he was fine and wanted to go to jail because his world was falling apart, minutes after they left he called back and stated that he wanted to, [employer] went back out and he had a knife to his throat and they had to tase him to get him to drop the knife, pt was telling dispatch that he wanted the police to shoot him. At this time patient states his "partner" was moving out and he was upset and trying to get his attention by goading the police.   The history is provided by the patient.    Past Medical History  Diagnosis Date  . HIV disease 2011  . Syphilis 2014  . Anal condyloma    Past Surgical History  Procedure Laterality Date  . Colon surgery     Family History  Problem Relation Age of Onset  . Diabetes Mother   . Diabetes Maternal Grandmother   . Diabetes Maternal Grandfather   . Cancer Maternal Grandmother     breast   History  Substance Use Topics  . Smoking status: Former Smoker    Types: Cigarettes  . Smokeless tobacco: Never Used  . Alcohol Use: Yes     Comment: occasionally    Review of Systems  Skin: Positive for wound.  Psychiatric/Behavioral: Positive for suicidal ideas and agitation. Negative for self-injury.      Allergies  Risperdal and Shellfish allergy  Home Medications   Prior to Admission medications   Not on File   BP 104/61  Pulse 87  Temp(Src) 98.2 F (36.8 C) (Oral)  Resp 18  SpO2 100% Physical Exam  Nursing note and vitals reviewed. Constitutional: He appears well-developed and well-nourished.  HENT:  Head: Normocephalic.  Eyes: Pupils are equal, round, and reactive to light.  Neck: Normal range of motion.  Cardiovascular:  Normal rate.   Pulmonary/Chest: Effort normal.  Musculoskeletal: Normal range of motion.  Neurological: He is alert.  Skin:  Patient has a taser dart in Left forearm   Psychiatric: His mood appears anxious. His affect is angry and inappropriate. His speech is rapid and/or pressured. He expresses inappropriate judgment.    ED Course  Procedures (including critical care time) Labs Review Labs Reviewed  CBC - Abnormal; Notable for the following:    WBC 3.9 (*)    RBC 4.15 (*)    HCT 38.7 (*)    All other components within normal limits  COMPREHENSIVE METABOLIC PANEL - Abnormal; Notable for the following:    Glucose, Bld 105 (*)    Total Protein 8.9 (*)    Total Bilirubin 0.2 (*)    All other components within normal limits  URINE RAPID DRUG SCREEN (HOSP PERFORMED) - Abnormal; Notable for the following:    Amphetamines POSITIVE (*)    All other components within normal limits  ETHANOL    Imaging Review No results found.   EKG Interpretation None      MDM  taser dart removed the area cleaned and bandaid applied  Final diagnoses:  Adjustment disorder with disturbance of emotion   Labs reviewed  Medically cleared for TTS assessment      Arman Filter, NP 12/13/13 0040  Dondra Spry  Wynona Luna, NP 12/13/13 1959  Arman Filter, NP 12/27/13 2050

## 2013-12-13 ENCOUNTER — Encounter (HOSPITAL_COMMUNITY): Payer: Self-pay | Admitting: Emergency Medicine

## 2013-12-13 DIAGNOSIS — F4329 Adjustment disorder with other symptoms: Secondary | ICD-10-CM

## 2013-12-13 LAB — COMPREHENSIVE METABOLIC PANEL
ALBUMIN: 4 g/dL (ref 3.5–5.2)
ALT: 35 U/L (ref 0–53)
AST: 30 U/L (ref 0–37)
Alkaline Phosphatase: 76 U/L (ref 39–117)
Anion gap: 14 (ref 5–15)
BUN: 11 mg/dL (ref 6–23)
CALCIUM: 9.2 mg/dL (ref 8.4–10.5)
CO2: 19 mEq/L (ref 19–32)
Chloride: 106 mEq/L (ref 96–112)
Creatinine, Ser: 0.7 mg/dL (ref 0.50–1.35)
GFR calc non Af Amer: >90 mL/min (ref >90)
GLUCOSE: 105 mg/dL — AB (ref 70–99)
POTASSIUM: 3.7 meq/L (ref 3.7–5.3)
SODIUM: 139 meq/L (ref 137–147)
TOTAL PROTEIN: 8.9 g/dL — AB (ref 6.0–8.3)
Total Bilirubin: 0.2 mg/dL — ABNORMAL LOW (ref 0.3–1.2)

## 2013-12-13 LAB — CBC
HCT: 38.7 % — ABNORMAL LOW (ref 39.0–52.0)
Hemoglobin: 13.1 g/dL (ref 13.0–17.0)
MCH: 31.6 pg (ref 26.0–34.0)
MCHC: 33.9 g/dL (ref 30.0–36.0)
MCV: 93.3 fL (ref 78.0–100.0)
Platelets: 173 10*3/uL (ref 150–400)
RBC: 4.15 MIL/uL — ABNORMAL LOW (ref 4.22–5.81)
RDW: 12.7 % (ref 11.5–15.5)
WBC: 3.9 10*3/uL — ABNORMAL LOW (ref 4.0–10.5)

## 2013-12-13 LAB — RAPID URINE DRUG SCREEN, HOSP PERFORMED
AMPHETAMINES: POSITIVE — AB
BENZODIAZEPINES: NOT DETECTED
Barbiturates: NOT DETECTED
COCAINE: NOT DETECTED
OPIATES: NOT DETECTED
Tetrahydrocannabinol: NOT DETECTED

## 2013-12-13 LAB — ETHANOL: Alcohol, Ethyl (B): <11 mg/dL (ref 0–11)

## 2013-12-13 MED ORDER — NICOTINE 21 MG/24HR TD PT24: 21.0000 mg | MEDICATED_PATCH | Freq: Every day | TRANSDERMAL | Status: DC

## 2013-12-13 MED ORDER — IBUPROFEN 200 MG PO TABS: 600.0000 mg | ORAL_TABLET | Freq: Three times a day (TID) | ORAL | Status: DC | PRN

## 2013-12-13 MED ORDER — ZOLPIDEM TARTRATE 5 MG PO TABS
5.0000 mg | ORAL_TABLET | Freq: Every evening | ORAL | Status: DC | PRN
  Administered 2013-12-13: 5 mg via ORAL
  Filled 2013-12-13: qty 1

## 2013-12-13 MED ORDER — ONDANSETRON HCL 4 MG PO TABS: 4.0000 mg | ORAL_TABLET | Freq: Three times a day (TID) | ORAL | Status: DC | PRN

## 2013-12-13 NOTE — BH Assessment (Signed)
Discharge per Dr. Jannifer Franklin. Patient given a list of referrals prior to discharge home.

## 2013-12-13 NOTE — ED Notes (Signed)
Patient appears anxious, is cooperative. Rates feelings of anxiety 1/10, depression 0/10. Denies SI, HI, AVH. Reports that the incident with the police was "an attention thing". States "I wanted my ex boyfriend to freak out". States that he and his ex broke up Thursday after going back and forth about their relationship. Reports that he was told a few weeks ago by [facility] that he has adjustment disorder. Patient inquired about his voluntary status and reiterates that he is not suicidal.   Encouragement offered. Reviewed IVC process with patient.   Q 15 safety checks in place.
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
## 2013-12-13 NOTE — [facility] Suicide Risk Assessment (Signed)
   Demographic Factors:  Male, Low socioeconomic status and Unemployed  Total Time spent with patient: 20 minutes  Psychiatric Specialty Exam: Physical Exam  Psychiatric: He has a normal mood and affect. His speech is normal and behavior is normal. Judgment and thought content normal. Cognition and memory are normal.    Review of Systems  Constitutional: Negative.   HENT: Negative.   Eyes: Negative.   Respiratory: Negative.   Cardiovascular: Negative.   Gastrointestinal: Negative.   Genitourinary: Negative.   Musculoskeletal: Negative.   Skin: Negative.   Neurological: Negative.   Endo/Heme/Allergies: Negative.   Psychiatric/Behavioral: Negative.     Blood pressure 104/61, pulse 87, temperature 98.2 F (36.8 C), temperature source Oral, resp. rate 18, SpO2 100.00%.There is no weight on file to calculate BMI.  General Appearance: Casual  Eye Contact::  Good  Speech:  Normal Rate  Volume:  Normal  Mood:  Euthymic  Affect:  Appropriate  Thought Process:  Goal Directed  Orientation:  Full (Time, Place, and Person)  Thought Content:  Negative  Suicidal Thoughts:  No  Homicidal Thoughts:  No  Memory:  Immediate;   Fair Recent;   Fair Remote;   Fair  Judgement:  Fair  Insight:  Fair  Psychomotor Activity:  Normal  Concentration:  Fair  Recall:  Good  Fund of Knowledge:Good  Language: Good  Akathisia:  No  Handed:  Right  AIMS (if indicated):     Assets:  Communication Skills Desire for Improvement  Sleep:   good    Musculoskeletal: Strength & Muscle Tone: within normal limits Gait & Station: normal Patient leans: N/A   Mental Status Per Nursing Assessment::   On Admission:     Current Mental Status by Physician: patient denies suicidal ideation, intent or plan  Loss Factors: Financial problems/change in socioeconomic status  Historical Factors: NA  Risk Reduction Factors:   Sense of responsibility to family, Living with another person, especially a  relative and Positive social support  Continued Clinical Symptoms:  Resolving mood symptoms  Cognitive Features That Contribute To Risk:  Closed-mindedness    Suicide Risk:  Minimal: No identifiable suicidal ideation.  Patients presenting with no risk factors but with morbid ruminations; may be classified as minimal risk based on the severity of the depressive symptoms  Discharge Diagnoses:   AXIS I:  Adjustment disorder with disturbance of emotion  AXIS II:  Cluster B Traits AXIS III:   Past Medical History  Diagnosis Date  . HIV disease 2011  . Syphilis 2014  . Anal condyloma    AXIS IV:  other psychosocial or environmental problems and problems related to social environment AXIS V:  61-70 mild symptoms  Plan Of Care/Follow-up recommendations:  Activity:  as tolerated Diet:  healthy  Is patient on multiple antipsychotic therapies at discharge:  No   Has Patient had three or more failed trials of antipsychotic monotherapy by history:  No  Recommended Plan for Multiple Antipsychotic Therapies: NA    Thedore Mins, MD 12/13/2013, 9:53 AM
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
## 2013-12-13 NOTE — ED Notes (Signed)
Bed: [facility] Expected date:  Expected time:  Means of arrival:  Comments: Hold for triage 2

## 2013-12-13 NOTE — ED Provider Notes (Signed)
Medical screening examination/treatment/procedure(s) were performed by non-physician practitioner and as supervising physician I was immediately available for consultation/collaboration.   EKG Interpretation None        Brandie Lopes, MD 12/13/13 2320 

## 2013-12-13 NOTE — Discharge Instructions (Signed)
Adjustment Disorder °Most changes in life can cause stress. Getting used to changes may take a few months or longer. If feelings of stress, hopelessness, or worry continue, you may have an adjustment disorder. This stress-related mental health problem may affect your feelings, thinking and how you act. It occurs in both sexes and happens at any age. °SYMPTOMS  °Some of the following problems may be seen and vary from person to person: °· Sadness or depression. °· Loss of enjoyment. °· Thoughts of suicide. °· Fighting. °· Avoiding family and friends. °· Poor school performance. °· Hopelessness, sense of loss. °· Trouble sleeping. °· Vandalism. °· Worry, weight loss or gain. °· Crying spells. °· Anxiety °· Reckless driving. °· Skipping school. °· Poor work performance. °· Nervousness. °· Ignoring bills. °· Poor attitude. °DIAGNOSIS  °Your caregiver will ask what has happened in your life and do a physical exam. They will make a diagnosis of an adjustment disorder when they are sure another problem or medical illness causing your feelings does not exist. °TREATMENT  °When problems caused by stress interfere with you daily life or last longer than a few months, you may need counseling for an adjustment disorder. Early treatment may diminish problems and help you to better cope with the stressful events in your life. Sometimes medication is necessary. Individual counseling and or support groups can be very helpful. °PROGNOSIS  °Adjustment disorders usually last less than 3 to 6 months. The condition may persist if there is long lasting stress. This could include health problems, relationship problems, or job difficulties where you can not easily escape from what is causing the problem. °PREVENTION  °Even the most mentally healthy, highly functioning people can suffer from an adjustment disorder given a significant blow from a life-changing event. There is no way to prevent pain and loss. Most people need help from time  to time. You are not alone. °SEEK MEDICAL CARE IF:  °Your feelings or symptoms listed above do not improve or worsen. °Document Released: 11/12/2005 Document Revised: 06/02/2011 Document Reviewed: 02/03/2007 °ExitCare® Patient Information ©2015 [employer]. This information is not intended to replace advice given to you by your health care provider. Make sure you discuss any questions you have with your health care provider. ° °

## 2013-12-15 ENCOUNTER — Ambulatory Visit: Payer: Medicaid Other | Admitting: Internal Medicine

## 2014-01-03 ENCOUNTER — Ambulatory Visit: Payer: Medicaid Other | Admitting: Internal Medicine

## 2014-01-04 NOTE — ED Provider Notes (Signed)
Medical screening examination/treatment/procedure(s) were performed by non-physician practitioner and as supervising physician I was immediately available for consultation/collaboration.   EKG Interpretation None        Loren Raceravid Keric Zehren, MD 01/04/14 [phone]

## 2014-02-09 ENCOUNTER — Emergency Department (HOSPITAL_COMMUNITY)
Admission: EM | Admit: 2014-02-09 | Discharge: 2014-02-09 | Discharge status: Against Medical Advice | Payer: Medicaid Other

## 2014-02-09 NOTE — ED Notes (Signed)
No answer

## 2014-02-09 NOTE — ED Notes (Signed)
Pt called with no answer

## 2014-02-09 NOTE — ED Notes (Signed)
Called pt for triage.  Pt did not respond.

## 2014-03-31 ENCOUNTER — Encounter (HOSPITAL_COMMUNITY): Payer: Self-pay | Admitting: *Deleted

## 2014-03-31 ENCOUNTER — Inpatient Hospital Stay (HOSPITAL_COMMUNITY)
Admission: EM | Admit: 2014-03-31 | Discharge: 2014-04-04 | DRG: 974 | Disposition: A | Discharge status: Home / Self Care | Payer: Self-pay | Attending: Oncology | Admitting: Oncology

## 2014-03-31 ENCOUNTER — Emergency Department (HOSPITAL_COMMUNITY): Payer: Medicaid Other

## 2014-03-31 ENCOUNTER — Other Ambulatory Visit: Payer: Self-pay

## 2014-03-31 DIAGNOSIS — Z21 Asymptomatic human immunodeficiency virus [HIV] infection status: Secondary | ICD-10-CM

## 2014-03-31 DIAGNOSIS — R509 Fever, unspecified: Secondary | ICD-10-CM

## 2014-03-31 DIAGNOSIS — Z9119 Patient's noncompliance with other medical treatment and regimen: Secondary | ICD-10-CM | POA: Diagnosis present

## 2014-03-31 DIAGNOSIS — E876 Hypokalemia: Secondary | ICD-10-CM | POA: Diagnosis present

## 2014-03-31 DIAGNOSIS — K529 Noninfective gastroenteritis and colitis, unspecified: Secondary | ICD-10-CM | POA: Diagnosis present

## 2014-03-31 DIAGNOSIS — B20 Human immunodeficiency virus [HIV] disease: Secondary | ICD-10-CM | POA: Diagnosis present

## 2014-03-31 DIAGNOSIS — B1920 Unspecified viral hepatitis C without hepatic coma: Secondary | ICD-10-CM | POA: Diagnosis present

## 2014-03-31 DIAGNOSIS — F419 Anxiety disorder, unspecified: Secondary | ICD-10-CM | POA: Diagnosis present

## 2014-03-31 DIAGNOSIS — Z87891 Personal history of nicotine dependence: Secondary | ICD-10-CM

## 2014-03-31 DIAGNOSIS — F329 Major depressive disorder, single episode, unspecified: Secondary | ICD-10-CM | POA: Diagnosis present

## 2014-03-31 DIAGNOSIS — D649 Anemia, unspecified: Secondary | ICD-10-CM | POA: Diagnosis present

## 2014-03-31 DIAGNOSIS — Z6821 Body mass index (BMI) 21.0-21.9, adult: Secondary | ICD-10-CM

## 2014-03-31 DIAGNOSIS — B182 Chronic viral hepatitis C: Secondary | ICD-10-CM | POA: Insufficient documentation

## 2014-03-31 DIAGNOSIS — F32A Depression, unspecified: Secondary | ICD-10-CM | POA: Diagnosis present

## 2014-03-31 DIAGNOSIS — J189 Pneumonia, unspecified organism: Principal | ICD-10-CM

## 2014-03-31 DIAGNOSIS — Z9114 Patient's other noncompliance with medication regimen: Secondary | ICD-10-CM | POA: Diagnosis present

## 2014-03-31 DIAGNOSIS — R197 Diarrhea, unspecified: Secondary | ICD-10-CM | POA: Diagnosis present

## 2014-03-31 DIAGNOSIS — E43 Unspecified severe protein-calorie malnutrition: Secondary | ICD-10-CM | POA: Diagnosis present

## 2014-03-31 DIAGNOSIS — D849 Immunodeficiency, unspecified: Secondary | ICD-10-CM

## 2014-03-31 DIAGNOSIS — R112 Nausea with vomiting, unspecified: Secondary | ICD-10-CM

## 2014-03-31 DIAGNOSIS — D899 Disorder involving the immune mechanism, unspecified: Secondary | ICD-10-CM

## 2014-03-31 HISTORY — DX: Diarrhea, unspecified: R19.7

## 2014-03-31 LAB — CBC WITH DIFFERENTIAL/PLATELET
BASOS PCT: 1 % (ref 0–1)
Basophils Absolute: 0 10*3/uL (ref 0.0–0.1)
Eosinophils Absolute: 0.3 10*3/uL (ref 0.0–0.7)
Eosinophils Relative: 3 % (ref 0–5)
HCT: 38.4 % — ABNORMAL LOW (ref 39.0–52.0)
HEMOGLOBIN: 13 g/dL (ref 13.0–17.0)
LYMPHS ABS: 1.6 10*3/uL (ref 0.7–4.0)
Lymphocytes Relative: 20 % (ref 12–46)
MCH: 32 pg (ref 26.0–34.0)
MCHC: 33.9 g/dL (ref 30.0–36.0)
MCV: 94.6 fL (ref 78.0–100.0)
Monocytes Absolute: 0.7 10*3/uL (ref 0.1–1.0)
Monocytes Relative: 8 % (ref 3–12)
NEUTROS PCT: 68 % (ref 43–77)
Neutro Abs: 5.6 10*3/uL (ref 1.7–7.7)
Platelets: 344 10*3/uL (ref 150–400)
RBC: 4.06 MIL/uL — AB (ref 4.22–5.81)
RDW: 12.4 % (ref 11.5–15.5)
WBC: 8.1 10*3/uL (ref 4.0–10.5)

## 2014-03-31 LAB — URINE MICROSCOPIC-ADD ON

## 2014-03-31 LAB — COMPREHENSIVE METABOLIC PANEL
ALBUMIN: 2.8 g/dL — AB (ref 3.5–5.2)
ALT: 16 U/L (ref 0–53)
ANION GAP: 10 (ref 5–15)
AST: 27 U/L (ref 0–37)
Alkaline Phosphatase: 65 U/L (ref 39–117)
BILIRUBIN TOTAL: 0.4 mg/dL (ref 0.3–1.2)
BUN: 8 mg/dL (ref 6–23)
CHLORIDE: 97 meq/L (ref 96–112)
CO2: 27 mmol/L (ref 19–32)
CREATININE: 0.78 mg/dL (ref 0.50–1.35)
Calcium: 8.6 mg/dL (ref 8.4–10.5)
Glucose, Bld: 89 mg/dL (ref 70–99)
Potassium: 3.4 mmol/L — ABNORMAL LOW (ref 3.5–5.1)
SODIUM: 134 mmol/L — AB (ref 135–145)
Total Protein: 8.3 g/dL (ref 6.0–8.3)

## 2014-03-31 LAB — LACTATE DEHYDROGENASE: LDH: 159 U/L (ref 94–250)

## 2014-03-31 LAB — I-STAT CG4 LACTIC ACID, ED
LACTIC ACID, VENOUS: 0.87 mmol/L (ref 0.5–2.2)
Lactic Acid, Venous: 0.69 mmol/L (ref 0.5–2.2)

## 2014-03-31 LAB — URINALYSIS, ROUTINE W REFLEX MICROSCOPIC
GLUCOSE, UA: NEGATIVE mg/dL
Hgb urine dipstick: NEGATIVE
Ketones, ur: 15 mg/dL — AB
NITRITE: NEGATIVE
Protein, ur: 30 mg/dL — AB
SPECIFIC GRAVITY, URINE: 1.029 (ref 1.005–1.030)
UROBILINOGEN UA: 1 mg/dL (ref 0.0–1.0)
pH: 6 (ref 5.0–8.0)

## 2014-03-31 LAB — I-STAT TROPONIN, ED: Troponin i, poc: 0 ng/mL (ref 0.00–0.08)

## 2014-03-31 LAB — MAGNESIUM: Magnesium: 1.9 mg/dL (ref 1.5–2.5)

## 2014-03-31 MED ORDER — PIPERACILLIN-TAZOBACTAM 3.375 G IVPB 30 MIN
3.3750 g | Freq: Once | INTRAVENOUS | Status: AC
  Administered 2014-03-31: 3.375 g via INTRAVENOUS
  Filled 2014-03-31: qty 50

## 2014-03-31 MED ORDER — CIPROFLOXACIN IN D5W 400 MG/200ML IV SOLN
400.0000 mg | Freq: Once | INTRAVENOUS | Status: AC
Start: 2014-03-31 — End: 2014-03-31
  Administered 2014-03-31: 400 mg via INTRAVENOUS
  Filled 2014-03-31: qty 200

## 2014-03-31 MED ORDER — ACETAMINOPHEN 650 MG RE SUPP: 650.0000 mg | Freq: Four times a day (QID) | RECTAL | Status: DC | PRN

## 2014-03-31 MED ORDER — CEFTRIAXONE SODIUM IN DEXTROSE 20 MG/ML IV SOLN
1.0000 g | INTRAVENOUS | Status: DC
  Administered 2014-03-31 – 2014-04-03 (×4): 1 g via INTRAVENOUS
  Filled 2014-03-31 (×5): qty 50

## 2014-03-31 MED ORDER — ACETAMINOPHEN 325 MG PO TABS
650.0000 mg | ORAL_TABLET | Freq: Four times a day (QID) | ORAL | Status: DC | PRN
  Administered 2014-04-01: 650 mg via ORAL
  Filled 2014-03-31 (×2): qty 2

## 2014-03-31 MED ORDER — SULFAMETHOXAZOLE-TRIMETHOPRIM 400-80 MG/5ML IV SOLN
400.0000 mg | Freq: Three times a day (TID) | INTRAVENOUS | Status: DC
  Administered 2014-03-31 – 2014-04-01 (×2): 400 mg via INTRAVENOUS
  Filled 2014-03-31 (×4): qty 25

## 2014-03-31 MED ORDER — PANTOPRAZOLE SODIUM 40 MG IV SOLR
40.0000 mg | Freq: Once | INTRAVENOUS | Status: AC
  Administered 2014-03-31: 40 mg via INTRAVENOUS
  Filled 2014-03-31: qty 40

## 2014-03-31 MED ORDER — DEXTROSE 5 % IV SOLN: 1.0000 g | INTRAVENOUS | Status: DC

## 2014-03-31 MED ORDER — SULFAMETHOXAZOLE-TRIMETHOPRIM 800-160 MG PO TABS
2.0000 | ORAL_TABLET | Freq: Once | ORAL | Status: AC
  Administered 2014-03-31: 2 via ORAL
  Filled 2014-03-31: qty 2

## 2014-03-31 MED ORDER — ONDANSETRON HCL 4 MG/2ML IJ SOLN: 4.0000 mg | Freq: Four times a day (QID) | INTRAMUSCULAR | Status: DC | PRN

## 2014-03-31 MED ORDER — ENOXAPARIN SODIUM 40 MG/0.4ML ~~LOC~~ SOLN
40.0000 mg | SUBCUTANEOUS | Status: DC
  Filled 2014-03-31 (×5): qty 0.4

## 2014-03-31 MED ORDER — SODIUM CHLORIDE 0.9 % IV SOLN: INTRAVENOUS | Status: DC

## 2014-03-31 MED ORDER — SODIUM CHLORIDE 0.9 % IV SOLN
INTRAVENOUS | Status: DC
  Administered 2014-03-31 – 2014-04-01 (×2): via INTRAVENOUS

## 2014-03-31 MED ORDER — ONDANSETRON HCL 4 MG PO TABS
4.0000 mg | ORAL_TABLET | Freq: Four times a day (QID) | ORAL | Status: DC | PRN
  Administered 2014-04-01: 4 mg via ORAL
  Filled 2014-03-31 (×2): qty 1

## 2014-03-31 MED ORDER — SODIUM CHLORIDE 0.9 % IV BOLUS (SEPSIS)
1000.0000 mL | INTRAVENOUS | Status: AC
  Administered 2014-03-31: 1000 mL via INTRAVENOUS

## 2014-03-31 MED ORDER — DEXTROSE 5 % IV SOLN
500.0000 mg | INTRAVENOUS | Status: DC
  Administered 2014-03-31 – 2014-04-03 (×4): 500 mg via INTRAVENOUS
  Filled 2014-03-31 (×5): qty 500

## 2014-03-31 MED ORDER — AZITHROMYCIN 500 MG IV SOLR: 500.0000 mg | INTRAVENOUS | Status: DC

## 2014-03-31 NOTE — H&P (Signed)
Date: 03/31/2014               Patient Name:  Jay Simon MRN: [id number]  DOB: Aug 16, 1990 Age / Sex: 24 y.o., male   PCP: Alveda Reasons, MD              Medical Service: Internal Medicine Teaching Service              Attending Physician: Dr. Annia Belt, MD    First Contact: Caprice Renshaw, MS3 Pager: [phone]  Second Contact: Dr. Raelene Bott Pager: [phone]  Third Contact Dr. Ronnald Ramp Pager: [phone]       After Hours (After 5p/  First Contact Pager: [phone]  weekends / holidays): Second Contact Pager: [phone]   Chief Complaint: SOB  History of Present Illness: Patient is a 24 yo male with a past medical history significant for uncontrolled HIV diagnosed in 2011, HCV, and drug use. He presents to the ED with 2 weeks of coughing, fevers, nausea, and vomiting. Today he developed shortness of breath and a right sided chest pain that prompted him to come to the ED. Patient states that 2 weeks ago he started feeling weak and developed a cough productive of yellow sputum. During this time he has also had fevers ranging from 100-105 that were fairly consistent the first week and subsided slightly over the second week. Over the last week he started developing nausea, vomiting, and diarrhea. Vomiting is mostly post-tussive and has been non-bilious, non-bloody. He has been having 2-3 bowel movements a day that have been watery and green/brown in color. This morning patient noted blood and a discharge per rectum, different from his diarrhea. 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 The last time patient took his Stribild was June of 2015 and was taking it inconsistently prior to that. Patient does not have a good social support system and has not been attending required meetings with case-workers which has resulted in discontinuation of his HIV services, all of which have contributed to his  noncompliance with medication.    Meds: Current Facility-Administered Medications  Medication Dose Route Frequency Provider Last Rate Last Dose  . ciprofloxacin (CIPRO) IVPB 400 mg  400 mg Intravenous Once Charlesetta Shanks, MD       No current outpatient prescriptions on file.    Allergies: Allergies as of 03/31/2014 - Review Complete 03/31/2014  Allergen Reaction Noted  . Risperdal [risperidone] Anaphylaxis 01/15/2013  . Shellfish allergy Anaphylaxis 01/15/2013   Past Medical History  Diagnosis Date  . HIV disease 2011  . Syphilis 2014  . Anal condyloma   . Diarrhea    Past Surgical History  Procedure Laterality Date  . Colon surgery     Family History  Problem Relation Age of Onset  . Diabetes Mother   . Diabetes Maternal Grandmother   . Diabetes Maternal Grandfather   . Cancer Maternal Grandmother     breast   History   Social History  . Marital Status: Single    Spouse Name: N/A    Number of Children: N/A  . Years of Education: N/A   Occupational History  . Not on file.   Social History Main Topics  . Smoking status: Former Smoker    Types: Cigarettes  . Smokeless tobacco: Never Used  . Alcohol Use: Yes     Comment: occasionally  . Drug Use: Yes  Special: Methamphetamines     Comment: last used 2 weeks ago  . Sexual Activity: Yes   Other Topics Concern  . Not on file   Social History Narrative    Review of Systems: Pertinent items are noted in HPI.  Physical Exam: Blood pressure 117/74, pulse 117, temperature 97.7 F (36.5 C), temperature source Oral, resp. rate 18, weight 56.501 kg (124 lb 9 oz), SpO2 100 %.   General appearance: alert, cooperative, in no distress Eyes: conjuctivae clear, PERRL, EOMs intact Throat: lis, mucosae, tongue normal, no lesions or evidence of candidal infection Lungs: clear to auscultation bilaterally, moving air well, no wheezes, no crackles  Heart: regular rate and rhythm, S1, S2 normal, no murmur, click,  rub, or gallop Abdomen: soft, nontender, no organomegaly Extremities: warm, well perfused, no cyanosis or edema Pulses: 2+ and symmetric Skin: warm, dry, intact, no rashes or lesions  Lab results: Basic Metabolic Panel:  Recent Labs Lab 03/31/14 1230  NA 134*  K 3.4*  CL 97  CO2 27  GLUCOSE 89  BUN 8  CREATININE 0.78  CALCIUM 8.6   Liver Function Tests:  Recent Labs Lab 03/31/14 1230  AST 27  ALT 16  ALKPHOS 65  BILITOT 0.4  PROT 8.3  ALBUMIN 2.8*   No results for input(s): LIPASE, AMYLASE in the last 168 hours. No results for input(s): AMMONIA in the last 168 hours. CBC:  Recent Labs Lab 03/31/14 1230  WBC 8.1  NEUTROABS 5.6  HGB 13.0  HCT 38.4*  MCV 94.6  PLT 344   Cardiac Enzymes: No results for input(s): CKTOTAL, CKMB, CKMBINDEX, TROPONINI in the last 168 hours. BNP: No results for input(s): PROBNP in the last 168 hours. D-Dimer: No results for input(s): DDIMER in the last 168 hours. CBG: No results for input(s): GLUCAP in the last 168 hours. Hemoglobin A1C: No results for input(s): HGBA1C in the last 168 hours. Fasting Lipid Panel: No results for input(s): CHOL, HDL, LDLCALC, TRIG, CHOLHDL, LDLDIRECT in the last 168 hours. Thyroid Function Tests: No results for input(s): TSH, T4TOTAL, FREET4, T3FREE, THYROIDAB in the last 168 hours. Coagulation: No results for input(s): LABPROT, INR in the last 168 hours. Anemia Panel: No results for input(s): VITAMINB12, FOLATE, FERRITIN, TIBC, IRON, RETICCTPCT in the last 168 hours. Urine Drug Screen: Drugs of Abuse     Component Value Date/Time   LABOPIA NONE DETECTED 12/13/2013 0050   COCAINSCRNUR NONE DETECTED 12/13/2013 0050   LABBENZ NONE DETECTED 12/13/2013 0050   AMPHETMU POSITIVE* 12/13/2013 0050   THCU NONE DETECTED 12/13/2013 0050   LABBARB NONE DETECTED 12/13/2013 0050    Alcohol Level: No results for input(s): ETH in the last 168 hours. Urinalysis:  Recent Labs Lab 03/31/14 1416    COLORURINE AMBER*  LABSPEC 1.029  PHURINE 6.0  GLUCOSEU NEGATIVE  HGBUR NEGATIVE  BILIRUBINUR SMALL*  KETONESUR 15*  PROTEINUR 30*  UROBILINOGEN 1.0  NITRITE NEGATIVE  LEUKOCYTESUR SMALL*      Imaging results:  Dg Chest 2 View  03/31/2014   CLINICAL DATA:  Productive cough and fever for 2 weeks  EXAM: CHEST  2 VIEW  COMPARISON:  03/10/2013  FINDINGS: Cardiac shadow is within normal limits. The lungs are well aerated bilaterally. Patchy infiltrates are noted in the bases bilateral projecting in the lingula and right middle lobe on the lateral projection consistent with acute pneumonia. No bony abnormality is noted.  IMPRESSION: Bibasilar pneumonia.   Electronically Signed   By: Inez Catalina M.D.   On:  03/31/2014 13:24    Other results: EKG: sinus tachycardia.  Assessment & Plan by Problem: Active Problems:   HIV (human immunodeficiency virus infection)  Mr. Cedrone is a 24 yo man with a past medical history significant for poorly controlled HIV and HCV who is presenting with 2 weeks of cough, fever, vomiting, diarrhea and recent onset of shortness of breath and chest pain. CXR revealed bilateral basilar infiltrates consistent with pneumonia. He is currently stable with an O2 saturation of 100% on room air.  1. Bilateral pneumonia: Patient presented with a 2 week history of cough, fever, and recent onset shortness of breath in the context of not taking his HIV medications since June 2015 and taking them inconsistently prior to that. CXR showed bilateral basilar infiltrates projecting into the lingular and right middle lobe consistent with an acute pneumonia. Patient's HIV+ status and noncompliance with medication as well as his clinical presentation put pneumocystic pneumonia high on the differential. His clinical symptoms consistent with PCP include symptom duration for 2 weeks, generalized fatigue, cough, fever, and shortness of breath and chest pain. Although the patient's O2 sat was  100% on room air, oxygen desaturation with PCP often occurs with activity. Possible causes also include mycobacterium avium complex as well as community acquired pneumonia. In the ED he was treated with bactrim, zosyn, and cipro for HCAP. Will start prophylactic antibiotics for CAP and pneumocystis pneumonia. He is currently stable on room air. - azithromycin 566m - ceftriaxone 1g - bactrim - influenza panel - LDH and legionella and strep pneumo urine antigen - O2 prn - ID consult   2. Diarrhea/Nausea: Patient has been having diarrhea and vomiting for the last week. Recently noted some blood. Given HIV+ status, many etiologies possible. Need to consider cryptosporidium, CMV, mycobacterium avium complex, HSV, as well as bacterial and viral causes. Cryptosporidium is high on the differential as patient has not been taking his antiviral medications as well as the fact that his diarrhea has for the greater part been watery. Patient did note some blood this morning, however, he also has a history of hemorrhoids which could explain the blood. This could also be a viral or bacterial gastroenteritis.  - zofran for nausea - stool culture - GI path panel - NS 1521mhr  3. HIV: Patient diagnosed in 2011 and has not been consistently taking his medication. Last time he took stribild was June 2015. Last CD4 count 5/15 was 300, HIV RNA was 38,650. - HIV RNA - CD4 count - ID consult to restart HIV medication  4. HCV: Last RNA 12/14 was 1,4,799,872LFTs normal with AST 27, ALT 16, Alk Phos 65. No current issues.   Diet: regular Prophylaxis: lovenox 4066mode: Full  This is a [employer] information officerte.  The care of the patient was discussed with Dr. NgoRaelene Bottd the assessment and plan was formulated with their assistance.  Please see their note for official documentation of the patient encounter.   Signed: HelVerlin Dikeed Student 03/31/2014, 3:51 PM

## 2014-03-31 NOTE — Progress Notes (Signed)
Pt arrived at 1845 into 6n21, will get vs and pass report onto next shift. A/o, no resp distress, went to bathroom and had BM. Instructed to call nurse as we need a specimen befor going to bathroom again.

## 2014-03-31 NOTE — H&P (Signed)
Date: 04/01/2014               Patient Name:  Jay Simon MRN: [id number]  DOB: 09-30-1990 Age / Sex: 24 y.o., male   PCP: Tobey Grim, MD         Medical Service: Internal Medicine Teaching Service         Attending Physician: Dr. Levert Feinstein, MD    First Contact: Dr. Loma Newton Pager: [phone]  Second Contact: Dr. Yetta Barre Pager: [phone]       After Hours (After 5p/  First Contact Pager: [phone]  weekends / holidays): Second Contact Pager: [phone]   Chief Complaint: dyspnea  History of Present Illness: [name] is a 24 y.o. male w/ PMHx of HIV (non-compliant w/ medications), and chronic Hepatitis C presents to the ED w/ complaints of cough for the past 2 weeks. Patient states that the cough was initially productive of white colored sputum but eventually became nonproductive. Patient also states associated weakness along with fever that has been as high as 103. Patient hasn't taken any medications besides NyQuil for the symptoms. Patient hasn't had medical evaluation for these symptoms either. Patient also states that he has had some vomiting that is sometimes posttussive with no blood. Patient also states that he has had nausea associated with the vomiting and is not able to keep much food down. Patient additionally reports that he has had diarrhea 2-3 times a day for the last week. Patient does state that he has a roommate who was sick.  Patient states that he has not been taking his HIV medications since last June. He states that this is because he had a bad breakup with his partner and had been demotivated to take his medications after that. Additionally, he has had issues in missing caseworker meetings with [facility] and therefore his services have been discontinued. Patient otherwise states that he has been having scant rectal discharge with a bit of blood. Patient otherwise denies any dysuria, hematuria, chest pain, abdominal pain.  Meds:  Current  Facility-Administered Medications  Medication Dose Route Frequency Provider Last Rate Last Dose  . 0.9 %  sodium chloride infusion   Intravenous Continuous Courtney Paris, MD 150 mL/hr at 03/31/14 2056    . acetaminophen (TYLENOL) tablet 650 mg  650 mg Oral Q6H PRN Courtney Paris, MD       Or  . acetaminophen (TYLENOL) suppository 650 mg  650 mg Rectal Q6H PRN Courtney Paris, MD      . azithromycin (ZITHROMAX) 500 mg in dextrose 5 % 250 mL IVPB  500 mg Intravenous Q24H Courtney Paris, MD   500 mg at 03/31/14 2153  . cefTRIAXone (ROCEPHIN) 1 g in dextrose 5 % 50 mL IVPB - Premix  1 g Intravenous Q24H Courtney Paris, MD   1 g at 03/31/14 2154  . enoxaparin (LOVENOX) injection 40 mg  40 mg Subcutaneous Q24H Courtney Paris, MD   40 mg at 03/31/14 2057  . ondansetron (ZOFRAN) tablet 4 mg  4 mg Oral Q6H PRN Courtney Paris, MD       Or  . ondansetron [facility]) injection 4 mg  4 mg Intravenous Q6H PRN Courtney Paris, MD      . sulfamethoxazole-trimethoprim (BACTRIM) 400 mg of trimethoprim in dextrose 5 % 500 mL IVPB  400 mg of trimethoprim Intravenous 3 times per day Arman Filter, RPH   400 mg of trimethoprim at 04/01/14 [phone]  Past Medical History  Diagnosis Date  . HIV disease 2011  . Syphilis 2014  . Anal condyloma   . Diarrhea    Past Surgical History  Procedure Laterality Date  . Colon surgery      Allergies: Allergies as of 03/31/2014 - Review Complete 03/31/2014  Allergen Reaction Noted  . Risperdal [risperidone] Anaphylaxis 01/15/2013  . Shellfish allergy Anaphylaxis 01/15/2013   Family History  Problem Relation Age of Onset  . Diabetes Mother   . Diabetes Maternal Grandmother   . Diabetes Maternal Grandfather   . Cancer Maternal Grandmother     breast   History   Social History  . Marital Status: Single    Spouse Name: N/A    Number of Children: N/A  . Years of Education: N/A   Occupational History  . Not on file.   Social History Main Topics  . Smoking status: Former  Smoker    Types: Cigarettes  . Smokeless tobacco: Never Used  . Alcohol Use: Yes     Comment: occasionally  . Drug Use: Yes    Special: Methamphetamines     Comment: last used 2 weeks ago  . Sexual Activity: Yes   Other Topics Concern  . Not on file   Social History Narrative    Review of Systems: All pertinent ROS as stated in HPI.   Physical Exam: Blood pressure 100/60, pulse 85, temperature 98.2 F (36.8 C), temperature source Axillary, resp. rate 16, height  (1.727 m), weight 65.409 kg (144 lb 3.2 oz), SpO2 100 %. General: resting in bed HEENT: PERRL, EOMI, no scleral icterus Cardiac: tachycardia, no rubs, murmurs or gallops Pulm: bibasilar fine rales, moving normal volumes of air Abd: soft, nontender, nondistended, BS present Ext: warm and well perfused, no pedal edema Neuro: alert and oriented X3, cranial nerves II-XII grossly intact Skin: no rashes or lesions noted Psych: appropriate affect  Lab results: Basic Metabolic Panel:  Recent Labs  91/47/82 1230 03/31/14 2148 04/01/14 0325  NA 134*  --  138  K 3.4*  --  3.4*  CL 97  --  106  CO2 27  --  22  GLUCOSE 89  --  83  BUN 8  --  <5*  CREATININE 0.78  --  0.75  CALCIUM 8.6  --  7.7*  MG  --  1.9  --    Liver Function Tests:  Recent Labs  03/31/14 1230  AST 27  ALT 16  ALKPHOS 65  BILITOT 0.4  PROT 8.3  ALBUMIN 2.8*   No results for input(s): LIPASE, AMYLASE in the last 72 hours. No results for input(s): AMMONIA in the last 72 hours. CBC:  Recent Labs  03/31/14 1230 04/01/14 0325  WBC 8.1 7.6  NEUTROABS 5.6  --   HGB 13.0 10.4*  HCT 38.4* 31.0*  MCV 94.6 94.5  PLT 344 283   Cardiac Enzymes: No results for input(s): CKTOTAL, CKMB, CKMBINDEX, TROPONINI in the last 72 hours. BNP: No results for input(s): PROBNP in the last 72 hours. D-Dimer: No results for input(s): DDIMER in the last 72 hours. CBG: No results for input(s): GLUCAP in the last 72 hours. Hemoglobin A1C: No  results for input(s): HGBA1C in the last 72 hours. Fasting Lipid Panel: No results for input(s): CHOL, HDL, LDLCALC, TRIG, CHOLHDL, LDLDIRECT in the last 72 hours. Thyroid Function Tests: No results for input(s): TSH, T4TOTAL, FREET4, T3FREE, THYROIDAB in the last 72 hours. Anemia Panel: No results for input(s): VITAMINB12, FOLATE,  FERRITIN, TIBC, IRON, RETICCTPCT in the last 72 hours. Coagulation: No results for input(s): LABPROT, INR in the last 72 hours. Urine Drug Screen: Drugs of Abuse     Component Value Date/Time   LABOPIA NONE DETECTED 03/31/2014 2245   COCAINSCRNUR NONE DETECTED 03/31/2014 2245   LABBENZ NONE DETECTED 03/31/2014 2245   AMPHETMU NONE DETECTED 03/31/2014 2245   THCU NONE DETECTED 03/31/2014 2245   LABBARB NONE DETECTED 03/31/2014 2245    Alcohol Level: No results for input(s): ETH in the last 72 hours. Urinalysis:  Recent Labs  03/31/14 1416  COLORURINE AMBER*  LABSPEC 1.029  PHURINE 6.0  GLUCOSEU NEGATIVE  HGBUR NEGATIVE  BILIRUBINUR SMALL*  KETONESUR 15*  PROTEINUR 30*  UROBILINOGEN 1.0  NITRITE NEGATIVE  LEUKOCYTESUR SMALL*   Imaging results:  Dg Chest 2 View  03/31/2014   CLINICAL DATA:  Productive cough and fever for 2 weeks  EXAM: CHEST  2 VIEW  COMPARISON:  03/10/2013  FINDINGS: Cardiac shadow is within normal limits. The lungs are well aerated bilaterally. Patchy infiltrates are noted in the bases bilateral projecting in the lingula and right middle lobe on the lateral projection consistent with acute pneumonia. No bony abnormality is noted.  IMPRESSION: Bibasilar pneumonia.   Electronically Signed   By: Alcide CleverMark  Lukens M.D.   On: 03/31/2014 13:24    Other results: EKG: sinus tachycardia, no major ST-T wave changes  Assessment & Plan by Problem: Active Problems:   HIV (human immunodeficiency virus infection)   Community acquired pneumonia  Community-acquired pneumonia in the setting of possible HIV/AIDS: Patient presenting with a  productive cough and fever for the last 2 weeks. Chest x-ray demonstrating bibasilar infiltrates. History of sick contact. Possible HIV/AIDS also expands the differential to opportunistic infection such as PCP. Patient tachycardic. Patient initially started on ciprofloxacin, zosyn, and bactrim the ED.  -Ceftriaxone and azithromycin (start date of 03/31/2014) -Continue IV bactrim for PCP coverage -Blood cultures sent -LDH -Respiratory virus panel pending -Supplemental oxygen -Pulse ox with ambulation  Nausea, vomiting, diarrhea: Likely secondary to gastroenteritis though immunocompromised state may expand the differential of possible pathogens. -NS IVF at 150 ml/hr -GI pathogen panel with attention to cyrpto -stool ova and parasites   HIV: Last seen in infectious disease clinic in May 2015. Last CD4 from then (7 months ago) at 300 with a viral load of [id number]. Patient has not taken any HIV medications for many months. -CD4 count and viral load pending -ID consult in the AM.  HCV: Pending further management as outpatient in ID clinic though patient lost to follow up. -Further outpatient follow-up  Hypokalemia: Admission potassium of 3.4. -Magnesium pending  Diet: Regular Prophylaxis: lovenox Code: Full  Dispo: Disposition is deferred at this time, awaiting improvement of current medical problems. Anticipated discharge in approximately 3 day(s).   The patient does have a current PCP Tobey Grim(Jeffrey H Walden, MD) and [facility].  The patient does not have transportation limitations that hinder transportation to clinic appointments.  Signed: Harold BarbanLawrence Ladarrious Kirksey, M.D., Ph.D. Internal Medicine Teaching Service, PGY-1 04/01/2014, 7:26 AM

## 2014-03-31 NOTE — ED Notes (Signed)
Pt reports her has had NVD for 2 weeks and  Has not been eating solid food for 2 weeks.Pt has not had any of his HIV meds for one year. Today pt walked approx. 4 miles and developed SHOB ,and rt sided chest pain. Pt reports he  Had rectal discharge that was different from diarrhea.

## 2014-03-31 NOTE — ED Notes (Signed)
Pt reports he weighted 145 2 weeks ago

## 2014-03-31 NOTE — ED Provider Notes (Signed)
CSN: [id number]     Arrival date & time 03/31/14  1204 History   First MD Initiated Contact with Patient 03/31/14 1359     Chief Complaint  Patient presents with  . Fever  . Diarrhea  . Cough     (Consider location/radiation/quality/duration/timing/severity/associated sxs/prior Treatment) HPI The patient reports he has had 2 weeks of developing cough. At this point he is also developed exertional shortness of breath. He is unsure if he had any fever. He has not been monitoring his temperature. Patient denies experiencing some right-sided chest pain with coughing. The patient has known HIV disease. He reports he has not been taking any of his medications for approximately a year. The patient reports some amount of rectal discharge that was not specifically diarrheal in nature. No associated vomiting. Past Medical History  Diagnosis Date  . HIV disease 2011  . Syphilis 2014  . Anal condyloma   . Diarrhea    Past Surgical History  Procedure Laterality Date  . Colon surgery     Family History  Problem Relation Age of Onset  . Diabetes Mother   . Diabetes Maternal Grandmother   . Diabetes Maternal Grandfather   . Cancer Maternal Grandmother     breast   History  Substance Use Topics  . Smoking status: Former Smoker    Types: Cigarettes  . Smokeless tobacco: Never Used  . Alcohol Use: Yes     Comment: occasionally    Review of Systems 10 Systems reviewed and are negative for acute change except as noted in the HPI.    Allergies  Risperdal; Shellfish allergy; and Stribild  Home Medications   Prior to Admission medications   Medication Sig Start Date End Date Taking? Authorizing Provider  ibuprofen (ADVIL,MOTRIN) 200 MG tablet Take 600 mg by mouth 2 (two) times daily as needed (migraines/pain).   Yes Historical Provider, MD   BP 100/60 mmHg  Pulse 82  Temp(Src) 97.7 F (36.5 C) (Axillary)  Resp 16  Ht 5\' 8"  (1.727 m)  Wt 144 lb 3.2 oz (65.409 kg)  BMI 21.93 kg/m2   SpO2 100% Physical Exam  Constitutional: He is oriented to person, place, and time.  The patient is thin but not cachectic. He is alert and nontoxic without acute respiratory distress. His mental status is clear.  HENT:  Head: Normocephalic and atraumatic.  Mouth/Throat: Oropharynx is clear and moist. No oropharyngeal exudate.  Eyes: EOM are normal. Pupils are equal, round, and reactive to light.  Neck: Neck supple.  Cardiovascular: Normal rate, regular rhythm, normal heart sounds and intact distal pulses.   Pulmonary/Chest: Effort normal. No respiratory distress. He has rales.  Abdominal: Soft. Bowel sounds are normal. He exhibits no distension. There is no tenderness.  Musculoskeletal: Normal range of motion. He exhibits no edema or tenderness.  Neurological: He is alert and oriented to person, place, and time. He has normal strength. Coordination normal. GCS eye subscore is 4. GCS verbal subscore is 5. GCS motor subscore is 6.  Skin: Skin is warm, dry and intact.  Psychiatric: He has a normal mood and affect.    ED Course  Procedures (including critical care time) Labs Review Labs Reviewed  CBC WITH DIFFERENTIAL - Abnormal; Notable for the following:    RBC 4.06 (*)    HCT 38.4 (*)    All other components within normal limits  COMPREHENSIVE METABOLIC PANEL - Abnormal; Notable for the following:    Sodium 134 (*)    Potassium 3.4 (*)  Albumin 2.8 (*)    All other components within normal limits  URINALYSIS, ROUTINE W REFLEX MICROSCOPIC - Abnormal; Notable for the following:    Color, Urine AMBER (*)    APPearance CLOUDY (*)    Bilirubin Urine SMALL (*)    Ketones, ur 15 (*)    Protein, ur 30 (*)    Leukocytes, UA SMALL (*)    All other components within normal limits  URINE MICROSCOPIC-ADD ON - Abnormal; Notable for the following:    Squamous Epithelial / LPF FEW (*)    Bacteria, UA FEW (*)    All other components within normal limits  BASIC METABOLIC PANEL -  Abnormal; Notable for the following:    Potassium 3.4 (*)    BUN <5 (*)    Calcium 7.7 (*)    All other components within normal limits  CBC - Abnormal; Notable for the following:    RBC 3.28 (*)    Hemoglobin 10.4 (*)    HCT 31.0 (*)    All other components within normal limits  CBC - Abnormal; Notable for the following:    RBC 3.49 (*)    Hemoglobin 10.5 (*)    HCT 32.7 (*)    All other components within normal limits  BASIC METABOLIC PANEL - Abnormal; Notable for the following:    BUN <5 (*)    Calcium 8.1 (*)    All other components within normal limits  CULTURE, BLOOD (ROUTINE X 2)  CULTURE, BLOOD (ROUTINE X 2)  URINE CULTURE  STOOL CULTURE  CLOSTRIDIUM DIFFICILE BY PCR  MAGNESIUM  LACTATE DEHYDROGENASE  STREP PNEUMONIAE URINARY ANTIGEN  INFLUENZA PANEL BY PCR (TYPE A & B, H1N1)  URINE RAPID DRUG SCREEN (HOSP PERFORMED)  MAGNESIUM  GI PATHOGEN PANEL BY PCR, STOOL  LEGIONELLA ANTIGEN, URINE  T-HELPER CELLS (CD4) COUNT  HIV 1 RNA QUANT-NO REFLEX-BLD  I-STAT CG4 LACTIC ACID, ED  I-STAT TROPOININ, ED  I-STAT CG4 LACTIC ACID, ED    Imaging Review Dg Chest 2 View  03/31/2014   CLINICAL DATA:  Productive cough and fever for 2 weeks  EXAM: CHEST  2 VIEW  COMPARISON:  03/10/2013  FINDINGS: Cardiac shadow is within normal limits. The lungs are well aerated bilaterally. Patchy infiltrates are noted in the bases bilateral projecting in the lingula and right middle lobe on the lateral projection consistent with acute pneumonia. No bony abnormality is noted.  IMPRESSION: Bibasilar pneumonia.   Electronically Signed   By: Alcide Clever M.D.   On: 03/31/2014 13:24     EKG Interpretation   Date/Time:  Friday March 31 2014 12:01:20 EST Ventricular Rate:  109 PR Interval:  150 QRS Duration: 96 QT Interval:  332 QTC Calculation: 447 R Axis:   84 Text Interpretation:  Sinus tachycardia Cannot rule out Inferior infarct ,  age undetermined Abnormal ECG ED PHYSICIAN INTERPRETATION  AVAILABLE IN  [facility] Confirmed by TEST, Record ([id number]) on 04/02/2014 8:56:02 AM      MDM   Final diagnoses:  Community acquired pneumonia  Immunocompromised patient  HIV (human immunodeficiency virus infection)   The patient presents as outlined above with immunocompromise state secondary to  untreated HIV disease. Broad-spectrum treatment for pneumonia is initiated with additional PCP coverage. The patient will be admitted for further management.    Arby Barrette, MD 04/02/14 0900

## 2014-03-31 NOTE — ED Notes (Signed)
Attempted report 

## 2014-03-31 NOTE — Progress Notes (Addendum)
ANTIBIOTIC CONSULT NOTE - INITIAL  Pharmacy Consult for Septra Indication: Presumed PCP pneumonia  Allergies  Allergen Reactions  . Risperdal [Risperidone] Anaphylaxis  . Shellfish Allergy Anaphylaxis    Patient Measurements: Height: 5\' 8"  (172.7 cm) Weight: 144 lb 3.2 oz (65.409 kg) IBW/kg (Calculated) : 68.4   Vital Signs: Temp: 98.9 F (37.2 C) (01/08 1849) Temp Source: Oral (01/08 1849) BP: 113/73 mmHg (01/08 1849) Pulse Rate: 94 (01/08 1849) Intake/Output from previous day:   Intake/Output from this shift:    Labs:  Recent Labs  03/31/14 1230  WBC 8.1  HGB 13.0  PLT 344  CREATININE 0.78   Estimated Creatinine Clearance: 130.6 mL/min (by C-G formula based on Cr of 0.78). No results for input(s): VANCOTROUGH, VANCOPEAK, VANCORANDOM, GENTTROUGH, GENTPEAK, GENTRANDOM, TOBRATROUGH, TOBRAPEAK, TOBRARND, AMIKACINPEAK, AMIKACINTROU, AMIKACIN in the last 72 hours.   Microbiology: No results found for this or any previous visit (from the past 720 hour(s)).  Medical History: Past Medical History  Diagnosis Date  . HIV disease 2011  . Syphilis 2014  . Anal condyloma   . Diarrhea     Medications:  Scheduled:  . azithromycin  500 mg Intravenous Q24H  . cefTRIAXone (ROCEPHIN)  IV  1 g Intravenous Q24H  . enoxaparin (LOVENOX) injection  40 mg Subcutaneous Q24H   Assessment: 24 y.o male presenting with a productive cough and fever for the last 2 weeks. Chest x-ray demonstrating bibasilar infiltrates. History of sick contact. Patient initially started on ciprofloxacin, zosyn, and bactrim the ED. MD to continue IV bactrim for PCP coverage. Started on prophylactic antibiotics for CAP and pneumocystis pneumonia. He is currently azithromycin, ceftriaxone and septra IV.   Plan:  Septra IV (400mg  of TMP component) IV q8h (= 18 mg of TMP/kg/day). Will follow clinical status daily and adjust dose for any renal function changes.   Thank you for allowing pharmacy to be  part of this patients care team. Noah Delaineuth Branch Pacitti, RPh Clinical Pharmacist Pager: [phone] 03/31/2014,7:44 PM

## 2014-04-01 ENCOUNTER — Other Ambulatory Visit: Payer: Self-pay

## 2014-04-01 ENCOUNTER — Encounter (HOSPITAL_COMMUNITY): Payer: Self-pay

## 2014-04-01 DIAGNOSIS — B20 Human immunodeficiency virus [HIV] disease: Secondary | ICD-10-CM

## 2014-04-01 DIAGNOSIS — B1920 Unspecified viral hepatitis C without hepatic coma: Secondary | ICD-10-CM | POA: Diagnosis present

## 2014-04-01 DIAGNOSIS — F32A Depression, unspecified: Secondary | ICD-10-CM | POA: Diagnosis present

## 2014-04-01 DIAGNOSIS — D649 Anemia, unspecified: Secondary | ICD-10-CM | POA: Diagnosis present

## 2014-04-01 DIAGNOSIS — F329 Major depressive disorder, single episode, unspecified: Secondary | ICD-10-CM

## 2014-04-01 DIAGNOSIS — Z87891 Personal history of nicotine dependence: Secondary | ICD-10-CM

## 2014-04-01 DIAGNOSIS — F419 Anxiety disorder, unspecified: Secondary | ICD-10-CM | POA: Diagnosis present

## 2014-04-01 LAB — INFLUENZA PANEL BY PCR (TYPE A & B)
H1N1FLUPCR: NOT DETECTED
INFLAPCR: NEGATIVE
Influenza B By PCR: NEGATIVE

## 2014-04-01 LAB — BASIC METABOLIC PANEL
ANION GAP: 10 (ref 5–15)
CALCIUM: 7.7 mg/dL — AB (ref 8.4–10.5)
CHLORIDE: 106 meq/L (ref 96–112)
CO2: 22 mmol/L (ref 19–32)
Creatinine, Ser: 0.75 mg/dL (ref 0.50–1.35)
GFR calc Af Amer: >90 mL/min (ref >90)
GLUCOSE: 83 mg/dL (ref 70–99)
Potassium: 3.4 mmol/L — ABNORMAL LOW (ref 3.5–5.1)
Sodium: 138 mmol/L (ref 135–145)

## 2014-04-01 LAB — URINE CULTURE: Colony Count: 35000

## 2014-04-01 LAB — RAPID URINE DRUG SCREEN, HOSP PERFORMED
Amphetamines: NOT DETECTED
Barbiturates: NOT DETECTED
Benzodiazepines: NOT DETECTED
COCAINE: NOT DETECTED
Opiates: NOT DETECTED
TETRAHYDROCANNABINOL: NOT DETECTED

## 2014-04-01 LAB — CBC
HEMATOCRIT: 31 % — AB (ref 39.0–52.0)
HEMOGLOBIN: 10.4 g/dL — AB (ref 13.0–17.0)
MCH: 31.7 pg (ref 26.0–34.0)
MCHC: 33.5 g/dL (ref 30.0–36.0)
MCV: 94.5 fL (ref 78.0–100.0)
Platelets: 283 10*3/uL (ref 150–400)
RBC: 3.28 MIL/uL — ABNORMAL LOW (ref 4.22–5.81)
RDW: 12.5 % (ref 11.5–15.5)
WBC: 7.6 10*3/uL (ref 4.0–10.5)

## 2014-04-01 LAB — STREP PNEUMONIAE URINARY ANTIGEN: STREP PNEUMO URINARY ANTIGEN: NEGATIVE

## 2014-04-01 MED ORDER — DEXTROSE 5 % IV SOLN
320.0000 mg | Freq: Three times a day (TID) | INTRAVENOUS | Status: DC
  Administered 2014-04-01 – 2014-04-03 (×6): 320 mg via INTRAVENOUS
  Filled 2014-04-01 (×13): qty 20

## 2014-04-01 MED ORDER — SODIUM CHLORIDE 0.9 % IV SOLN
INTRAVENOUS | Status: DC
  Administered 2014-04-01 – 2014-04-02 (×2): via INTRAVENOUS

## 2014-04-01 MED ORDER — POTASSIUM CHLORIDE CRYS ER 20 MEQ PO TBCR
40.0000 meq | EXTENDED_RELEASE_TABLET | Freq: Once | ORAL | Status: AC
  Administered 2014-04-01: 40 meq via ORAL
  Filled 2014-04-01: qty 2

## 2014-04-01 NOTE — Progress Notes (Signed)
Subjective:  Ambulated in hallway with some dyspnea though no desaturations. One episode of emesis overnight with associated dyspnea. Resolved after the episode.   Objective: Vital signs in last 24 hours: Filed Vitals:   03/31/14 1758 03/31/14 1849 03/31/14 2147 04/01/14 0552  BP: 110/76 113/73 107/66 100/60  Pulse: 100 94 95 85  Temp:  98.9 F (37.2 C) 98.7 F (37.1 C) 98.2 F (36.8 C)  TempSrc:  Oral Oral Axillary  Resp: Height:   (1.727 m)    Weight:  65.409 kg (144 lb 3.2 oz)    SpO2: 100% 100% 100% 100%   Weight change:   Intake/Output Summary (Last 24 hours) at 04/01/14 0651 Last data filed at 04/01/14 0557  Gross per 24 hour  Intake   1750 ml  Output    800 ml  Net    950 ml   General: resting in bed HEENT: PERRL, EOMI, no scleral icterus Cardiac: tachycardia, no rubs, murmurs or gallops Pulm: bibasilar fine rales, moving normal volumes of air Abd: soft, nontender, nondistended, BS present Ext: warm and well perfused, no pedal edema Neuro: alert and oriented X3, cranial nerves II-XII grossly intact Skin: no rashes or lesions noted Psych: appropriate affect  Lab Results: Basic Metabolic Panel:  Recent Labs Lab 03/31/14 1230 03/31/14 2148 04/01/14 0325  NA 134*  --  138  K 3.4*  --  3.4*  CL 97  --  106  CO2 27  --  22  GLUCOSE 89  --  83  BUN 8  --  <5*  CREATININE 0.78  --  0.75  CALCIUM 8.6  --  7.7*  MG  --  1.9  --    Liver Function Tests:  Recent Labs Lab 03/31/14 1230  AST 27  ALT 16  ALKPHOS 65  BILITOT 0.4  PROT 8.3  ALBUMIN 2.8*   No results for input(s): LIPASE, AMYLASE in the last 168 hours. No results for input(s): AMMONIA in the last 168 hours. CBC:  Recent Labs Lab 03/31/14 1230 04/01/14 0325  WBC 8.1 7.6  NEUTROABS 5.6  --   HGB 13.0 10.4*  HCT 38.4* 31.0*  MCV 94.6 94.5  PLT 344 283   Cardiac Enzymes: No results for input(s): CKTOTAL, CKMB, CKMBINDEX, TROPONINI in the last 168  hours. BNP: No results for input(s): PROBNP in the last 168 hours. D-Dimer: No results for input(s): DDIMER in the last 168 hours. CBG: No results for input(s): GLUCAP in the last 168 hours. Hemoglobin A1C: No results for input(s): HGBA1C in the last 168 hours. Fasting Lipid Panel: No results for input(s): CHOL, HDL, LDLCALC, TRIG, CHOLHDL, LDLDIRECT in the last 168 hours. Thyroid Function Tests: No results for input(s): TSH, T4TOTAL, FREET4, T3FREE, THYROIDAB in the last 168 hours. Coagulation: No results for input(s): LABPROT, INR in the last 168 hours. Anemia Panel: No results for input(s): VITAMINB12, FOLATE, FERRITIN, TIBC, IRON, RETICCTPCT in the last 168 hours. Urine Drug Screen: Drugs of Abuse     Component Value Date/Time   LABOPIA NONE DETECTED 03/31/2014 2245   COCAINSCRNUR NONE DETECTED 03/31/2014 2245   LABBENZ NONE DETECTED 03/31/2014 2245   AMPHETMU NONE DETECTED 03/31/2014 2245   THCU NONE DETECTED 03/31/2014 2245   LABBARB NONE DETECTED 03/31/2014 2245    Alcohol Level: No results for input(s): ETH in the last 168 hours. Urinalysis:  Recent Labs Lab 03/31/14 1416  COLORURINE AMBER*  LABSPEC 1.029  PHURINE 6.0  GLUCOSEU NEGATIVE  HGBUR NEGATIVE  BILIRUBINUR SMALL*  KETONESUR 15*  PROTEINUR 30*  UROBILINOGEN 1.0  NITRITE NEGATIVE  LEUKOCYTESUR SMALL*   Micro Results: No results found for this or any previous visit (from the past 240 hour(s)). Studies/Results: Dg Chest 2 View  03/31/2014   CLINICAL DATA:  Productive cough and fever for 2 weeks  EXAM: CHEST  2 VIEW  COMPARISON:  03/10/2013  FINDINGS: Cardiac shadow is within normal limits. The lungs are well aerated bilaterally. Patchy infiltrates are noted in the bases bilateral projecting in the lingula and right middle lobe on the lateral projection consistent with acute pneumonia. No bony abnormality is noted.  IMPRESSION: Bibasilar pneumonia.   Electronically Signed   By: Alcide CleverMark  Lukens M.D.   On:  03/31/2014 13:24   Medications: I have reviewed the patient's current medications. Scheduled Meds: . azithromycin  500 mg Intravenous Q24H  . cefTRIAXone (ROCEPHIN)  IV  1 g Intravenous Q24H  . enoxaparin (LOVENOX) injection  40 mg Subcutaneous Q24H  . sulfamethoxazole-trimethoprim  400 mg of trimethoprim Intravenous 3 times per day   Continuous Infusions: . sodium chloride 150 mL/hr at 03/31/14 2056   PRN Meds:.acetaminophen **OR** acetaminophen, ondansetron **OR** ondansetron (ZOFRAN) IV Assessment/Plan: Active Problems:   HIV (human immunodeficiency virus infection)   Community acquired pneumonia  Community-acquired pneumonia in the setting of possible HIV/AIDS: Patient with persistent cough since admission. However, patient has remained afebrile with no leukocytosis. Chest x-ray with bilateral patchy infiltrates. Possible HIV/AIDS also expands the differential to opportunistic infection such as PCP. However, PCP pneumonia seems to be less likely given a normal LDH and 100% oxygen saturation with ambulation. Cultures returning 1 anaerobic bottle remarkable for gram-positive rods. -Ceftriaxone and azithromycin (start date of 03/31/2014) -Consider discontinuation of IV Bactrim given lack of evidence of PCP clinical findings. However, CD4 count may not be resulted until Monday afternoon. -Respiratory virus panel pending  HIV: Last seen in infectious disease clinic in May 2015. Last CD4 from then (7 months ago) at 300 with a viral load of [id number]. Patient has not taken any HIV medications for many months. -CD4 count and viral load pending -ID consult  Nausea, vomiting, diarrhea: Likely secondary to gastroenteritis though immunocompromised state may expand the differential of possible pathogens. -NS IVF at 100 ml/hr from 150 ml/hr -GI pathogen panel with attention to cyrpto -stool ova and parasites   HCV: Pending further management as outpatient in ID clinic though patient lost to follow  up. -Further outpatient follow-up  Hypokalemia: Admission potassium of 3.4, stable from yesterday. -kdur 40 meq  Diet: Regular Prophylaxis: lovenox Code: Full  Dispo: Disposition is deferred at this time, awaiting improvement of current medical problems. Anticipated discharge in approximately 3 day(s).   The patient does have a current PCP Tobey Grim(Jeffrey H Walden, MD) and [facility] after discharge.  The patient does not have transportation limitations that hinder transportation to clinic appointments.  .Services Needed at time of discharge: Y = Yes, Blank = No PT:   OT:   RN:   Equipment:   Other:     LOS: 1 day   Harold BarbanLawrence Kennisha Qin, MD 04/01/2014, 6:51 AM

## 2014-04-01 NOTE — Progress Notes (Signed)
Ambulated out in the hallway. O2 sat stayed at 100 % on room air while walking.

## 2014-04-01 NOTE — Progress Notes (Signed)
Subjective: Still feels weak and having some nausea. Had one episode of vomiting overnight. Continues to have a non-productive cough and some shortness of breath when he ambulates.  Objective: Vital signs in last 24 hours: Filed Vitals:   03/31/14 1758 03/31/14 1849 03/31/14 2147 04/01/14 0552  BP: 110/76 113/73 107/66 100/60  Pulse: 100 94 95 85  Temp:  98.9 F (37.2 C) 98.7 F (37.1 C) 98.2 F (36.8 C)  TempSrc:  Oral Oral Axillary  Resp: _0 Height:  _1  (1.727 m)    Weight:  65.409 kg (144 lb 3.2 oz)    SpO2: 100% 100% 100% 100%   Weight change:   Intake/Output Summary (Last 24 hours) at 04/01/14 1223 Last data filed at 04/01/14 1100  Gross per 24 hour  Intake   2708 ml  Output   1200 ml  Net   1508 ml   General: resting in bed HEENT: PERRL, EOMI, no scleral icterus Cardiac: tachycardia, no rubs, murmurs or gallops Pulm: bibasilar fine rales, moving normal volumes of air Abd: soft, nontender, nondistended, BS present Ext: warm and well perfused, no pedal edema Neuro: alert and oriented X3, cranial nerves II-XII grossly intact Skin: no rashes or lesions noted Psych: appropriate affect  Lab Results: Basic Metabolic Panel:  Recent Labs Lab 03/31/14 1230 03/31/14 2148 04/01/14 0325  NA 134*  --  138  K 3.4*  --  3.4*  CL 97  --  106  CO2 27  --  22  GLUCOSE 89  --  83  BUN 8  --  <5*  CREATININE 0.78  --  0.75  CALCIUM 8.6  --  7.7*  MG  --  1.9  --    Liver Function Tests:  Recent Labs Lab 03/31/14 1230  AST 27  ALT 16  ALKPHOS 65  BILITOT 0.4  PROT 8.3  ALBUMIN 2.8*   No results for input(s): LIPASE, AMYLASE in the last 168 hours. No results for input(s): AMMONIA in the last 168 hours. CBC:  Recent Labs Lab 03/31/14 1230 04/01/14 0325  WBC 8.1 7.6  NEUTROABS 5.6  --   HGB 13.0 10.4*  HCT 38.4* 31.0*  MCV 94.6 94.5  PLT 344 283   Cardiac Enzymes: No results for input(s): CKTOTAL, CKMB, CKMBINDEX, TROPONINI in the last  168 hours. BNP: No results for input(s): PROBNP in the last 168 hours. D-Dimer: No results for input(s): DDIMER in the last 168 hours. CBG: No results for input(s): GLUCAP in the last 168 hours. Hemoglobin A1C: No results for input(s): HGBA1C in the last 168 hours. Fasting Lipid Panel: No results for input(s): CHOL, HDL, LDLCALC, TRIG, CHOLHDL, LDLDIRECT in the last 168 hours. Thyroid Function Tests: No results for input(s): TSH, T4TOTAL, FREET4, T3FREE, THYROIDAB in the last 168 hours. Coagulation: No results for input(s): LABPROT, INR in the last 168 hours. Anemia Panel: No results for input(s): VITAMINB12, FOLATE, FERRITIN, TIBC, IRON, RETICCTPCT in the last 168 hours. Urine Drug Screen: Drugs of Abuse     Component Value Date/Time   LABOPIA NONE DETECTED 03/31/2014 2245   COCAINSCRNUR NONE DETECTED 03/31/2014 2245   LABBENZ NONE DETECTED 03/31/2014 2245   AMPHETMU NONE DETECTED 03/31/2014 2245   THCU NONE DETECTED 03/31/2014 2245   LABBARB NONE DETECTED 03/31/2014 2245    Alcohol Level: No results for input(s): ETH in the last 168 hours. Urinalysis:  Recent Labs Lab 03/31/14 1416  COLORURINE AMBER*  LABSPEC 1.029  PHURINE 6.0  GLUCOSEU  NEGATIVE  HGBUR NEGATIVE  BILIRUBINUR SMALL*  KETONESUR 15*  PROTEINUR 30*  UROBILINOGEN 1.0  NITRITE NEGATIVE  LEUKOCYTESUR SMALL*     Micro Results: No results found for this or any previous visit (from the past 240 hour(s)). Studies/Results: Dg Chest 2 View  03/31/2014   CLINICAL DATA:  Productive cough and fever for 2 weeks  EXAM: CHEST  2 VIEW  COMPARISON:  03/10/2013  FINDINGS: Cardiac shadow is within normal limits. The lungs are well aerated bilaterally. Patchy infiltrates are noted in the bases bilateral projecting in the lingula and right middle lobe on the lateral projection consistent with acute pneumonia. No bony abnormality is noted.  IMPRESSION: Bibasilar pneumonia.   Electronically Signed   By: Inez Catalina M.D.    On: 03/31/2014 13:24   Medications:  Scheduled Meds: . azithromycin  500 mg Intravenous Q24H  . cefTRIAXone (ROCEPHIN)  IV  1 g Intravenous Q24H  . enoxaparin (LOVENOX) injection  40 mg Subcutaneous Q24H  . sulfamethoxazole-trimethoprim  400 mg of trimethoprim Intravenous 3 times per day   Continuous Infusions: . sodium chloride 150 mL/hr at 04/01/14 1003   PRN Meds:.acetaminophen **OR** acetaminophen, ondansetron **OR** ondansetron (ZOFRAN) IV Assessment/Plan: Active Problems:   HIV (human immunodeficiency virus infection)   Community acquired pneumonia  Jay Simon is a 24 yo man with a past medical history significant for poorly controlled HIV and HCV who is presenting with 2 weeks of cough, fever, vomiting, diarrhea and recent onset of shortness of breath and chest pain. CXR revealed bilateral basilar infiltrates consistent with pneumonia. He is currently stable with an O2 saturation of 100% on room air.  1. Bilateral pneumonia: Patient presented with a 2 week history of cough, fever, and recent onset shortness of breath in the context of not taking his HIV medications since June 2015 and taking them inconsistently prior to that. CXR showed bilateral basilar infiltrates projecting into the lingular and right middle lobe consistent with an acute pneumonia. Patient's HIV+ status and noncompliance with medication as well as his clinical presentation put pneumocystic pneumonia high on the differential. His clinical symptoms consistent with PCP include symptom duration for 2 weeks, generalized fatigue, cough, fever, and shortness of breath and chest pain. Although the patient's O2 sat was 100% on room air, oxygen desaturation with PCP often occurs with activity. Possible causes also include mycobacterium avium complex as well as community acquired pneumonia. In the ED he was treated with bactrim, zosyn, and cipro for HCAP.  - azithromycin 567m - ceftriaxone 1g - bactrim - blood culture grew  gram positive rods today - influenza panel pending - LDH normal 159, strep pneumo urine antigen negative - legionella urine antigen pending - O2 prn - ID consult for further antibiotic management   2. Nausea, vomiting, diarrhea: Patient has been having diarrhea and vomiting for the last week. Recently noted some blood. Given HIV+ status, many etiologies possible. Need to consider cryptosporidium, CMV, mycobacterium avium complex, HSV, as well as bacterial and viral causes. Cryptosporidium is on the differential as patient has not been taking his antiviral medications as well as the fact that his diarrhea has for the greater part been watery. Patient did note some blood this morning, however, he also has a history of hemorrhoids which could explain the blood. This could also be a viral or bacterial gastroenteritis.  - zofran for nausea - stool culture pending - GI path panel pending - NS 1067mhr  3. HIV: Patient diagnosed in 2011 and has not  been consistently taking his medication. Last time he took stribild was June 2015. Last CD4 count 5/15 was 300, HIV RNA was 38,650. - HIV RNA pending - CD4 count pending - ID consult   4. HCV: Last RNA 12/14 was 9,292,446. LFTs normal with AST 27, ALT 16, Alk Phos 65. No current issues. - further outpatient follow up  Diet: Regular Prophylaxis: lovenox Code: Full  Dispo: Disposition is deferred at this time, awaiting improvement of current medical problems. Anticipated discharge in approximately 3 day(s).   The patient does have a current PCP Jay Reasons, MD) and does need an [facility] hospital follow-up appointment after discharge.  The patient does not have transportation limitations that hinder transportation to clinic appointments.  This is a Careers information officer Note.  The care of the patient was discussed with Dr. Raelene Bott and the assessment and plan formulated with their assistance.  Please see their attached note for official documentation of the  daily encounter.   LOS: 1 day   [employer] Student 04/01/2014, 12:23 PM

## 2014-04-01 NOTE — Progress Notes (Signed)
Blood culture result reported to resident on call.

## 2014-04-01 NOTE — Progress Notes (Signed)
Patient ID: Jay Simon, male   DOB: 05/10/1990, 24 y.o.   MRN: [id number]          Patient Active Problem List   Diagnosis Date Noted  . HIV (human immunodeficiency virus infection) 03/31/2014    Priority: High  . Community acquired pneumonia 03/31/2014    Priority: High  . Diarrhea     Priority: High  . Depression 04/01/2014  . Anxiety 04/01/2014  . Hepatitis C 04/01/2014  . Normocytic anemia 04/01/2014  . 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 Rash and nonspecific skin eruption 03/10/2013  . Cough 03/10/2013  . Renal abscess, right 02/22/2013  . Protein-calorie malnutrition, severe 02/22/2013  . 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        Subjective: Jay Simon (he likes to be called Jay Simon) was diagnosed with HIV infection in 2011 while living in [place]. He recalls that his CD4 count was around 164 at that time. He was initially started on Atripla and then switched to Stribild. He moved to [place] for a short period of time and then moved here a little over one year ago. He was off of his Stribild at that time. He was admitted to the hospital in November 2014 with an Escherichia coli pyelonephritis complicated by right renal abscess. He did follow-up in our clinic in January 2015 and was restarted on Stribild. His CD4 count rose from 100 to around 300 and his viral load suppressed transiently to 346 from a high of 115,000. However he began having problems with pruritus. He also was having difficulty with anxiety and depression and fighting with his ex-boyfriend. He eventually stopped taking his Stribild in May of last year. His last viral load obtained around that time had gone back up to 38,650.  He states that he was also  having trouble getting along with his [facility] case manager so he quit coming to our clinic and was lost to follow-up. [facility] emergency department [facility] for anxiety and suicidal ideations. He states that he remains depressed but has not had had thoughts of hurting himself recently. He has been living with a friend and was planning on moved to live with another friend in [place] soon. He admits that he feels quite alone and isolated hearing [place].  About 2 weeks ago he developed a cough. Initially it was productive of white sputum but then became a dry hacking cough. He has been having some shortness of breath when coughing and walking. He has also developed fever, chills and sweats. He has anorexia and has lost about 10 pounds unintentionally. He has also been having diarrhea.  Review of Systems: Constitutional: positive for chills, fevers, sweats and weight loss, negative for anorexia Eyes: negative Ears, nose, mouth, throat, and face: negative Respiratory: as noted in history of present illness Cardiovascular: negative Gastrointestinal: positive for diarrhea, nausea, reflux symptoms and vomiting, negative for abdominal pain and odynophagia Genitourinary:negative  Past Medical History  Diagnosis Date  . HIV disease 2011  . Syphilis 2014  . Anal condyloma   . Diarrhea     History  Substance Use Topics  . Smoking status: Former Smoker    Types: Cigarettes  . Smokeless tobacco: Never Used  .  Alcohol Use: Yes     Comment: occasionally    Family History  Problem Relation Age of Onset  . Diabetes Mother   . Diabetes Maternal Grandmother   . Diabetes Maternal Grandfather   . Cancer Maternal Grandmother     breast    Allergies  Allergen Reactions  . Risperdal [Risperidone] Anaphylaxis  . Shellfish Allergy Anaphylaxis  . Stribild [Elviteg-Cobicis-Emtricit-Tenof] Itching    Objective: Temp: 98.5 F (36.9 C) (01/09  1349) Temp Source: Oral (01/09 1349) BP: 98/64 mmHg (01/09 1349) Pulse Rate: 93 (01/09 1349) Body mass index is 21.93 kg/(m^2).  General: He is alert and in no distress Oral: No thrush. He has once all ulcer behind his lower front teeth at the gumline Skin: He has some erythema surrounding the adhesive telemetry patches on his chest Lungs: Diminished breath sounds Cor: Regular S1 and S2 with no murmurs Abdomen: Soft and nontender Joints and extremities: Normal Neuro: Alert with normal speech and conversation Mood and affect: Appropriate he does not seem anxious or depressed  Lab Results Lab Results  Component Value Date   WBC 7.6 04/01/2014   HGB 10.4* 04/01/2014   HCT 31.0* 04/01/2014   MCV 94.5 04/01/2014   PLT 283 04/01/2014    Lab Results  Component Value Date   CREATININE 0.75 04/01/2014   BUN <5* 04/01/2014   NA 138 04/01/2014   K 3.4* 04/01/2014   CL 106 04/01/2014   CO2 22 04/01/2014    Lab Results  Component Value Date   ALT 16 03/31/2014   AST 27 03/31/2014   ALKPHOS 65 03/31/2014   BILITOT 0.4 03/31/2014    No results found for: CHOL, HDL, LDLCALC, LDLDIRECT, TRIG, CHOLHDL  Lab Results HIV 1 RNA QUANT (copies/mL)  Date Value  08/09/2013 38650*  04/19/2013 346*  02/21/2013 35536*   CD4 T CELL ABS (/uL)  Date Value  08/09/2013 300*  04/19/2013 320*  02/21/2013 100*   CHEST 2 VIEW 03/31/2014  COMPARISON: 03/10/2013  FINDINGS: Cardiac shadow is within normal limits. The lungs are well aerated bilaterally. Patchy infiltrates are noted in the bases bilateral projecting in the lingula and right middle lobe on the lateral projection consistent with acute pneumonia. No bony abnormality is noted.  IMPRESSION: Bibasilar pneumonia.   Electronically Signed  By: Alcide Clever M.D.  On: 03/31/2014 13:24   Assessment: Jay Simon has developed patchy bibasilar pneumonia and diarrhea in the setting of untreated HIV infection. I agree with  treatment for presumed community-acquired pneumonia. His infiltrates are focal and patchy and his LDL is normal which makes pneumocystis less likely but I agree with continuing oral trimethoprim sulfamethoxazole for now. I don't think we will be able to get any expectorated sputum for testing. I would add a C. difficile PCR to the other test ordered for evaluation of his diarrhea.  Plan: 1. Continue ceftriaxone, azithromycin and trimethoprim sulfamethoxazole 2. Check stool culture and C. difficile PCR 3. Repeat CD4 and HIV viral load 4. Monitor room air oxygen saturation while walking   Cliffton Asters, MD [facility] [phone] pager   [phone] cell 04/01/2014, 4:47 PM

## 2014-04-01 NOTE — Progress Notes (Signed)
ANTIBIOTIC CONSULT NOTE - INITIAL  Pharmacy Consult for Septra Indication: Presumed PCP pneumonia  Allergies  Allergen Reactions  . Risperdal [Risperidone] Anaphylaxis  . Shellfish Allergy Anaphylaxis    Patient Measurements: Height: 5\' 8"  (172.7 cm) Weight: 144 lb 3.2 oz (65.409 kg) IBW/kg (Calculated) : 68.4   Vital Signs: Temp: 98.5 F (36.9 C) (01/09 1349) Temp Source: Oral (01/09 1349) BP: 98/64 mmHg (01/09 1349) Pulse Rate: 93 (01/09 1349) Intake/Output from previous day: 01/08 0701 - 01/09 0700 In: 1868 [P.O.:118; I.V.:1700; IV Piggyback:50] Out: 800 [Urine:800] Intake/Output from this shift: Total I/O In: 1080 [P.O.:1080] Out: 400 [Urine:400]  Labs:  Recent Labs  03/31/14 1230 04/01/14 0325  WBC 8.1 7.6  HGB 13.0 10.4*  PLT 344 283  CREATININE 0.78 0.75   Estimated Creatinine Clearance: 130.6 mL/min (by C-G formula based on Cr of 0.75). No results for input(s): VANCOTROUGH, VANCOPEAK, VANCORANDOM, GENTTROUGH, GENTPEAK, GENTRANDOM, TOBRATROUGH, TOBRAPEAK, TOBRARND, AMIKACINPEAK, AMIKACINTROU, AMIKACIN in the last 72 hours.   Microbiology: Recent Results (from the past 720 hour(s))  Blood Culture (routine x 2)     Status: None (Preliminary result)   Collection Time: 03/31/14 12:30 PM  Result Value Ref Range Status   Specimen Description BLOOD LEFT ARM  Final   Special Requests BOTTLES DRAWN AEROBIC AND ANAEROBIC 10ML  Final   Culture   Final           BLOOD CULTURE RECEIVED NO GROWTH TO DATE CULTURE WILL BE HELD FOR 5 DAYS BEFORE ISSUING A FINAL NEGATIVE REPORT Performed at [facility]    Report Status PENDING  Incomplete  Blood Culture (routine x 2)     Status: None (Preliminary result)   Collection Time: 03/31/14  3:00 PM  Result Value Ref Range Status   Specimen Description BLOOD LEFT FOREARM  Final   Special Requests BOTTLES DRAWN AEROBIC AND ANAEROBIC 5CC  Final   Culture   Final    GRAM POSITIVE RODS Note: Gram Stain Report Called  to,Read Back By and Verified With: JOANNE HAMZE 04/01/14 @ 12:32PM BY RUSCOE A. Performed at [facility]    Report Status PENDING  Incomplete    Medical History: Past Medical History  Diagnosis Date  . HIV disease 2011  . Syphilis 2014  . Anal condyloma   . Diarrhea     Medications:  Scheduled:  . azithromycin  500 mg Intravenous Q24H  . cefTRIAXone (ROCEPHIN)  IV  1 g Intravenous Q24H  . enoxaparin (LOVENOX) injection  40 mg Subcutaneous Q24H  . sulfamethoxazole-trimethoprim  320 mg Intravenous 3 times per day   Assessment: 24 y.o male HIV positive presenting with a productive cough and fever for the last 2 weeks. Chest x-ray demonstrating bibasilar infiltrates. Covering for CAP and PCP with ceftriaxone/azithromycin/Septra   Plan:  Septra IV 5mg /kg q8h (320mg  IV q8h) Will follow clinical status daily and adjust dose for any renal function changes.   Thank you for allowing pharmacy to be part of this patients care team.  Celedonio MiyamotoJeremy Crawford Tamura, PharmD, BCPS Clinical Pharmacist Pager [phone]   04/01/2014,1:59 PM

## 2014-04-02 LAB — CBC
HCT: 32.7 % — ABNORMAL LOW (ref 39.0–52.0)
HEMOGLOBIN: 10.5 g/dL — AB (ref 13.0–17.0)
MCH: 30.1 pg (ref 26.0–34.0)
MCHC: 32.1 g/dL (ref 30.0–36.0)
MCV: 93.7 fL (ref 78.0–100.0)
Platelets: 281 10*3/uL (ref 150–400)
RBC: 3.49 MIL/uL — ABNORMAL LOW (ref 4.22–5.81)
RDW: 12.9 % (ref 11.5–15.5)
WBC: 5.2 10*3/uL (ref 4.0–10.5)

## 2014-04-02 LAB — BASIC METABOLIC PANEL
Anion gap: 6 (ref 5–15)
BUN: <5 mg/dL — ABNORMAL LOW (ref 6–23)
CALCIUM: 8.1 mg/dL — AB (ref 8.4–10.5)
CO2: 24 mmol/L (ref 19–32)
Chloride: 108 mEq/L (ref 96–112)
Creatinine, Ser: 0.79 mg/dL (ref 0.50–1.35)
GFR calc Af Amer: >90 mL/min (ref >90)
Glucose, Bld: 95 mg/dL (ref 70–99)
Potassium: 3.6 mmol/L (ref 3.5–5.1)
Sodium: 138 mmol/L (ref 135–145)

## 2014-04-02 LAB — MAGNESIUM: MAGNESIUM: 1.9 mg/dL (ref 1.5–2.5)

## 2014-04-02 LAB — CULTURE, BLOOD (ROUTINE X 2)

## 2014-04-02 LAB — CLOSTRIDIUM DIFFICILE BY PCR: CDIFFPCR: NEGATIVE

## 2014-04-02 NOTE — Progress Notes (Signed)
Subjective: Still w/ nausea and vomiting. Breathing well, no oxygen requirement. Remained in mid-90's SpO2 while ambulating on room air.   Objective: Vital signs in last 24 hours: Filed Vitals:   04/02/14 0529 04/02/14 1014 04/02/14 1016 04/02/14 1434  BP: 100/60   108/66  Pulse: 82   80  Temp: 97.7 F (36.5 C)   97.8 F (36.6 C)  TempSrc: Axillary   Oral  Resp: 16   18  Height:      Weight:      SpO2: 100% 100% 95% 100%   Weight change:   Intake/Output Summary (Last 24 hours) at 04/02/14 1800 Last data filed at 04/02/14 1730  Gross per 24 hour  Intake   6268 ml  Output   1600 ml  Net   4668 ml   Physical Exam: General: Thin-appearing AA male, alert, cooperative, NAD. HEENT: PERRL, EOMI. Moist mucus membranes Neck: Full range of motion without pain, supple, no lymphadenopathy or carotid bruits Lungs: Clear to ascultation bilaterally, normal work of respiration, no wheezes, rales, rhonchi Heart: RRR, no murmurs, gallops, or rubs Abdomen: Soft, non-tender, non-distended, BS + Extremities: No cyanosis, clubbing, or edema Neurologic: Alert & oriented X3, cranial nerves II-XII intact, strength grossly intact, sensation intact to light touch    Lab Results: Basic Metabolic Panel:  Recent Labs Lab 03/31/14 2148 04/01/14 0325 04/02/14 0610  NA  --  138 138  K  --  3.4* 3.6  CL  --  106 108  CO2  --  22 24  GLUCOSE  --  83 95  BUN  --  <5* <5*  CREATININE  --  0.75 0.79  CALCIUM  --  7.7* 8.1*  MG 1.9  --  1.9   Liver Function Tests:  Recent Labs Lab 03/31/14 1230  AST 27  ALT 16  ALKPHOS 65  BILITOT 0.4  PROT 8.3  ALBUMIN 2.8*   CBC:  Recent Labs Lab 03/31/14 1230 04/01/14 0325 04/02/14 0610  WBC 8.1 7.6 5.2  NEUTROABS 5.6  --   --   HGB 13.0 10.4* 10.5*  HCT 38.4* 31.0* 32.7*  MCV 94.6 94.5 93.7  PLT 344 283 281   Urine Drug Screen: Drugs of Abuse     Component Value Date/Time   LABOPIA NONE DETECTED 03/31/2014 2245   COCAINSCRNUR  NONE DETECTED 03/31/2014 2245   LABBENZ NONE DETECTED 03/31/2014 2245   AMPHETMU NONE DETECTED 03/31/2014 2245   THCU NONE DETECTED 03/31/2014 2245   LABBARB NONE DETECTED 03/31/2014 2245     Urinalysis:  Recent Labs Lab 03/31/14 1416  COLORURINE AMBER*  LABSPEC 1.029  PHURINE 6.0  GLUCOSEU NEGATIVE  HGBUR NEGATIVE  BILIRUBINUR SMALL*  KETONESUR 15*  PROTEINUR 30*  UROBILINOGEN 1.0  NITRITE NEGATIVE  LEUKOCYTESUR SMALL*   Micro Results: Recent Results (from the past 240 hour(s))  Blood Culture (routine x 2)     Status: None (Preliminary result)   Collection Time: 03/31/14 12:30 PM  Result Value Ref Range Status   Specimen Description BLOOD LEFT ARM  Final   Special Requests BOTTLES DRAWN AEROBIC AND ANAEROBIC  Final   Culture   Final           BLOOD CULTURE RECEIVED NO GROWTH TO DATE CULTURE WILL BE HELD FOR 5 DAYS BEFORE ISSUING A FINAL NEGATIVE REPORT Performed at [facility]    Report Status PENDING  Incomplete  Urine culture     Status: None   Collection Time: 03/31/14  2:16 PM  Result Value Ref Range Status   Specimen Description URINE, CLEAN CATCH  Final   Special Requests NONE  Final   Colony Count   Final    35,000 COLONIES/ML Performed at [facility]    Culture   Final    Multiple bacterial morphotypes present, none predominant. Suggest appropriate recollection if clinically indicated. Performed at [facility]    Report Status 04/01/2014 FINAL  Final  Blood Culture (routine x 2)     Status: None   Collection Time: 03/31/14  3:00 PM  Result Value Ref Range Status   Specimen Description BLOOD LEFT FOREARM  Final   Special Requests BOTTLES DRAWN AEROBIC AND ANAEROBIC 5CC  Final   Culture   Final    BACILLUS SPECIES Note: Standardized susceptibility testing for this organism is not available. Note: Gram Stain Report Called to,Read Back By and Verified With: JOANNE HAMZE 04/01/14 @ 12:32PM BY RUSCOE A. Performed at [facility]    Report Status 04/02/2014 FINAL  Final  Stool culture     Status: None (Preliminary result)   Collection Time: 03/31/14  8:10 PM  Result Value Ref Range Status   Specimen Description STOOL  Final   Special Requests NONE  Final   Culture   Final    NO SUSPICIOUS COLONIES, CONTINUING TO HOLD Performed at [facility]    Report Status PENDING  Incomplete  Clostridium Difficile by PCR     Status: None   Collection Time: 04/02/14  3:45 PM  Result Value Ref Range Status   C difficile by pcr NEGATIVE NEGATIVE Final   Studies/Results: No results found. Medications: I have reviewed the patient's current medications. Scheduled Meds: . azithromycin  500 mg Intravenous Q24H  . cefTRIAXone (ROCEPHIN)  IV  1 g Intravenous Q24H  . enoxaparin (LOVENOX) injection  40 mg Subcutaneous Q24H  . sulfamethoxazole-trimethoprim  320 mg Intravenous 3 times per day   Continuous Infusions: . sodium chloride 100 mL/hr at 04/02/14 1337   PRN Meds:.acetaminophen **OR** acetaminophen, ondansetron **OR** ondansetron (ZOFRAN) IV   Assessment/Plan: 24 y/o M w/ PMHx of HIV non-compliant w/ medications, presented to ED w/ N/V/D and cough, admitted for bilateral pulmonary infiltrates.   CAP: CXR suggestive of bilateral pulmonary infiltrates. SpO2 stable w/ ambulation. LDH normal. Still possibility of PCP pneumonia in the setting of non-compliance w/ HIV medications. 1/2 blood cultures positive for bacillus species, likely contaminant. Continue to follow. Flu negative.  -ID following; appreciate recs -Continue Ceftriaxone + Azithromycin (start date of 03/31/2014) -Also continue Bactrim IV for now per ID  HIV: Last seen in ID clinic 07/2013. Non-compliant w/ HIV medications, previously taking Stribild.  -CD4, VL pending -ID following  N/V/D: Likely secondary to gastroenteritis though immunocompromised state may expand the differential of possible pathogens. C. Diff negative. Stool culture  negative to date, pathogen panel pending.  -Continue IVF; NS @ 100 cc/hr given continued symptoms.  -Patient wants to continue regular diet -Zofran prn  HCV: Pending further management as outpatient in ID clinic though patient lost to follow up. -Further outpatient follow-up  Hypokalemia: Resolved.  -Repeat BMP in AM  DVT/PE PPx: Lovenox Jay Simon  Dispo: Disposition is deferred at this time, awaiting improvement of current medical problems. Anticipated discharge in approximately 1-2 day(s).   The patient does have a current PCP Tobey Grim(Jeffrey H Walden, MD) and [facility] after discharge.  The patient does not have transportation limitations that hinder transportation  to clinic appointments.  .Services Needed at time of discharge: Y = Yes, Blank = No PT:   OT:   RN:   Equipment:   Other:     LOS: 2 days   Courtney Paris, MD 04/02/2014, 6:00 PM

## 2014-04-02 NOTE — Progress Notes (Signed)
Patient ID: Jay Simon, male   DOB: 06/21/1990, 24 y.o.   MRN: [id number]         [facility]    Date of Admission:  03/31/2014           Day 3 ceftriaxone        Day 3 azithromycin        Day 3 trimethoprim sulfamethoxazole  Principal Problem:   Community acquired pneumonia Active Problems:   Diarrhea   HIV (human immunodeficiency virus infection)   Protein-calorie malnutrition, severe   Depression   Anxiety   Hepatitis C   Normocytic anemia   . azithromycin  500 mg Intravenous Q24H  . cefTRIAXone (ROCEPHIN)  IV  1 g Intravenous Q24H  . enoxaparin (LOVENOX) injection  40 mg Subcutaneous Q24H  . sulfamethoxazole-trimethoprim  320 mg Intravenous 3 times per day    Subjective: He states that his cough is a little bit better. He says that he walked around the unit without supplemental oxygen and there was no drop in his oxygen saturation. He admits that his dyspnea on exertion is also improving. He still having some diarrhea, nausea and vomiting.  Review of Systems: Pertinent items are noted in HPI.  Past Medical History  Diagnosis Date  . HIV disease 2011  . Syphilis 2014  . Anal condyloma   . Diarrhea     History  Substance Use Topics  . Smoking status: Former Smoker    Types: Cigarettes  . Smokeless tobacco: Never Used  . Alcohol Use: Yes     Comment: occasionally    Family History  Problem Relation Age of Onset  . Diabetes Mother   . Diabetes Maternal Grandmother   . Diabetes Maternal Grandfather   . Cancer Maternal Grandmother     breast   Allergies  Allergen Reactions  . Risperdal [Risperidone] Anaphylaxis  . Shellfish Allergy Anaphylaxis  . Stribild [Elviteg-Cobicis-Emtricit-Tenof] Itching    OBJECTIVE: Blood pressure 100/60, pulse 82, temperature 97.7 F (36.5 C), temperature source Axillary, resp. rate 16, height 5\' 8"  (1.727 m), weight 144 lb 3.2 oz (65.409 kg), SpO2 95 %. General: He is alert and in no  distress Skin: Multiple tattoos. No rash Lungs: Clear without crackles or wheezing Cor: Regular S1 and S2 with no murmurs Abdomen: Soft and nontender  Lab Results Lab Results  Component Value Date   WBC 5.2 04/02/2014   HGB 10.5* 04/02/2014   HCT 32.7* 04/02/2014   MCV 93.7 04/02/2014   PLT 281 04/02/2014    Lab Results  Component Value Date   CREATININE 0.79 04/02/2014   BUN <5* 04/02/2014   NA 138 04/02/2014   K 3.6 04/02/2014   CL 108 04/02/2014   CO2 24 04/02/2014    Lab Results  Component Value Date   ALT 16 03/31/2014   AST 27 03/31/2014   ALKPHOS 65 03/31/2014   BILITOT 0.4 03/31/2014     Microbiology: Recent Results (from the past 240 hour(s))  Blood Culture (routine x 2)     Status: None (Preliminary result)   Collection Time: 03/31/14 12:30 PM  Result Value Ref Range Status   Specimen Description BLOOD LEFT ARM  Final   Special Requests BOTTLES DRAWN AEROBIC AND ANAEROBIC 10ML  Final   Culture   Final           BLOOD CULTURE RECEIVED NO GROWTH TO DATE CULTURE WILL BE HELD FOR 5 DAYS BEFORE ISSUING A FINAL NEGATIVE REPORT Performed at [facility]    Report Status PENDING  Incomplete  Urine culture     Status: None   Collection Time: 03/31/14  2:16 PM  Result Value Ref Range Status   Specimen Description URINE, CLEAN CATCH  Final   Special Requests NONE  Final   Colony Count   Final    35,000 COLONIES/ML Performed at [facility]    Culture   Final    Multiple bacterial morphotypes present, none predominant. Suggest appropriate recollection if clinically indicated. Performed at [facility]    Report Status 04/01/2014 FINAL  Final  Blood Culture (routine x 2)     Status: None   Collection Time: 03/31/14  3:00 PM  Result Value Ref Range Status   Specimen Description BLOOD LEFT FOREARM  Final   Special Requests BOTTLES DRAWN AEROBIC AND ANAEROBIC 5CC  Final   Culture   Final    BACILLUS SPECIES Note: Standardized  susceptibility testing for this organism is not available. Note: Gram Stain Report Called to,Read Back By and Verified With: JOANNE HAMZE 04/01/14 @ 12:32PM BY RUSCOE A. Performed at [facility]    Report Status 04/02/2014 FINAL  Final    Assessment: He is improving slowly on therapy for community-acquired pneumonia. I doubt that he has pneumocystis but would continue trimethoprim sulfamethoxazole pending tomorrow CD4 count. He will probably be ready to discharge on oral therapy in the next 48 hours. So far stool studies are still pending. He will need follow-up in our clinic shortly after discharge to get him back on an antiretroviral regimen he can tolerate. He also needs assistance with his chronic depression and housing.  Plan: 1. Continue current antibiotics for now 2. Await stool studies, CD4 count and HIV viral load  Cliffton Asters, MD [facility] for Infectious Disease [facility] [phone] pager   [phone] cell 04/02/2014, 12:22 PM

## 2014-04-03 DIAGNOSIS — D649 Anemia, unspecified: Secondary | ICD-10-CM

## 2014-04-03 DIAGNOSIS — F419 Anxiety disorder, unspecified: Secondary | ICD-10-CM

## 2014-04-03 DIAGNOSIS — E43 Unspecified severe protein-calorie malnutrition: Secondary | ICD-10-CM

## 2014-04-03 DIAGNOSIS — B182 Chronic viral hepatitis C: Secondary | ICD-10-CM | POA: Insufficient documentation

## 2014-04-03 LAB — BASIC METABOLIC PANEL
ANION GAP: 5 (ref 5–15)
BUN: <5 mg/dL — ABNORMAL LOW (ref 6–23)
CALCIUM: 8 mg/dL — AB (ref 8.4–10.5)
CO2: 20 mmol/L (ref 19–32)
Chloride: 108 mEq/L (ref 96–112)
Creatinine, Ser: 0.82 mg/dL (ref 0.50–1.35)
GFR calc Af Amer: >90 mL/min (ref >90)
GLUCOSE: 95 mg/dL (ref 70–99)
POTASSIUM: 3.7 mmol/L (ref 3.5–5.1)
Sodium: 133 mmol/L — ABNORMAL LOW (ref 135–145)

## 2014-04-03 LAB — T-HELPER CELLS (CD4) COUNT (NOT AT ARMC)
CD4 % Helper T Cell: 14 % — ABNORMAL LOW (ref 33–55)
CD4 T Cell Abs: 280 /uL — ABNORMAL LOW (ref 400–2700)

## 2014-04-03 LAB — STOOL CULTURE

## 2014-04-03 MED ORDER — DIPHENHYDRAMINE HCL 25 MG PO CAPS
25.0000 mg | ORAL_CAPSULE | Freq: Once | ORAL | Status: AC
  Administered 2014-04-03: 25 mg via ORAL
  Filled 2014-04-03: qty 1

## 2014-04-03 NOTE — Discharge Summary (Signed)
Name: Jay Simon MRN: [id number] DOB: 11-May-1990 24 y.o. PCP: Alveda Reasons, MD  Date of Admission: 03/31/2014  1:55 PM Date of Discharge: 04/04/2014 Attending Physician: Murriel Hopper  Discharge Diagnosis:  Principal Problem:   Community acquired pneumonia Active Problems:   Protein-calorie malnutrition, severe   Diarrhea   HIV (human immunodeficiency virus infection)   Depression   Anxiety   Hepatitis C   Normocytic anemia   Chronic hepatitis C without hepatic coma  Discharge Medications:   Medication List    TAKE these medications        amoxicillin 250 MG capsule  Commonly known as:  AMOXIL  Take 3 capsules (750 mg total) by mouth 2 (two) times daily.     ibuprofen 200 MG tablet  Commonly known as:  ADVIL,MOTRIN  Take 600 mg by mouth 2 (two) times daily as needed (migraines/pain).     ondansetron 4 MG tablet  Commonly known as:  ZOFRAN  Take 1 tablet (4 mg total) by mouth every 8 (eight) hours as needed for nausea or vomiting.        Disposition and follow-up:   JayJenson Simon was discharged from [facility] in Good condition.  At the hospital follow up visit please address:  1.  Resolution of pneumonia, vomiting, and diarrhea.  2.  Labs / imaging needed at time of follow-up: none  3.  Pending labs/ test needing follow-up: HIV-1 Integrase Genotype, HIV-1 RNA Genotype, HLA B GI pathogen panel, Urine legionella antigen  Follow-up Appointments:  Jan 20th 3:15 with Dr. Linus Salmons (Difficulty contacting patient. Will continue to call.)  Follow-up Information    Follow up with [facility], MD. Go on 04/18/2014.   Specialty:  Family Medicine   Why:  11:00am hospital follow up   Contact information:   [place] [address] [phone]       Discharge Instructions: Discharge Instructions    Call MD for:  difficulty breathing, headache or visual disturbances    Complete by:  As directed      Call MD  for:  extreme fatigue    Complete by:  As directed      Call MD for:  hives    Complete by:  As directed      Call MD for:  persistant dizziness or light-headedness    Complete by:  As directed      Call MD for:  persistant nausea and vomiting    Complete by:  As directed      Call MD for:  redness, tenderness, or signs of infection (pain, swelling, redness, odor or green/yellow discharge around incision site)    Complete by:  As directed      Call MD for:  severe uncontrolled pain    Complete by:  As directed      Call MD for:  temperature >100.4    Complete by:  As directed      Diet - low sodium heart healthy    Complete by:  As directed      Increase activity slowly    Complete by:  As directed            Consultations:  Infectious Disease  Procedures Performed:  Dg Chest 2 View  03/31/2014   CLINICAL DATA:  Productive cough and fever for 2 weeks  EXAM: CHEST  2 VIEW  COMPARISON:  03/10/2013  FINDINGS: Cardiac shadow is within normal limits. The lungs are well aerated bilaterally. Patchy infiltrates  are noted in the bases bilateral projecting in the lingula and right middle lobe on the lateral projection consistent with acute pneumonia. No bony abnormality is noted.  IMPRESSION: Bibasilar pneumonia.   Electronically Signed   By: Inez Catalina M.D.   On: 03/31/2014 13:24   Admission HPI: [name] is a 24 y.o. male w/ PMHx of HIV (non-compliant w/ medications), and chronic Hepatitis C presents to the ED w/ complaints of cough for the past 2 weeks. Patient states that the cough was initially productive of white colored sputum but eventually became nonproductive. Patient also states associated weakness along with fever that has been as high as 103. Patient hasn't taken any medications besides NyQuil for the symptoms. Patient hasn't had medical evaluation for these symptoms either. Patient also states that he has had some vomiting that is sometimes posttussive with no blood.  Patient also states that he has had nausea associated with the vomiting and is not able to keep much food down. Patient additionally reports that he has had diarrhea 2-3 times a day for the last week. Patient does state that he has a roommate who was sick.  Patient states that he has not been taking his HIV medications since last June. He states that this is because he had a bad breakup with his partner and had been demotivated to take his medications after that. Additionally, he has had issues in missing caseworker meetings with [facility] and therefore his services have been discontinued. Patient otherwise states that he has been having scant rectal discharge with a bit of blood. Patient otherwise denies any dysuria, hematuria, chest pain, abdominal pain.  Hospital Course by problem list: Principal Problem:   Community acquired pneumonia Active Problems:   Protein-calorie malnutrition, severe   Diarrhea   HIV (human immunodeficiency virus infection)   Depression   Anxiety   Hepatitis C   Normocytic anemia   Chronic hepatitis C without hepatic coma   Community-acquired pneumonia: Mr. Delarosa came in with complaints of a productive cough and fever for 2 weeks in the setting of non-compliance with his HIV medication. On presentation patient was afebrile and tachycardic with 100% oxygen saturation on room air. Chest x-ray demonstrated bibasilar infiltrates consistent with pneumonia. Patient was initially started on ciprofloxacin, zosyn, and bactrim the ED. Given his HIV history and unknown CD4 count there was concern for PCP. Patient was flu negative and 1/2 blood cultures grew bacillus spp, thought to be a contaminate.Patient was started on IV azithromycin, ceftriaxone, and bactrim for CAP and PCP coverage. Subsequent labs revealed a normal LDH and CD4 count of 280, therefore, bactrim was discontinued on day 4. Patient continued treatment for CAP and received 4 doses of IV azithromycin and  ceftriaxone and was transitioned to oral amoxacillin upon discharge (planned end date of 04/06/14). Throughout the hospitalization patient remained afebrile and maintained an oxygen saturation in the high 90s-100.   Nausea, vomiting, diarrhea: Patient came in with 1 week of nausea, vomiting, and diarrhea. His vomiting was non-bloody, non-bilious. Diarrhea was mostly watery with scant blood noted on day of admission. Differential at the time included gastroenteritis, C. Difficile, and cryptosporidium given his immunocompromised state. Stool studies were performed. Stool cultures revealed no growth. Patient was managed symptomatically with IVF and zofran for his nausea. Emesis and diarrhea both improved.   HIV: Patient has a history of being noncompliant with his HAART. He was last seen in the infectious disease clinic in May 2015 where his CD4 count was  300 with a [place] of [address]. In the hospital CD4 count was 280 and viral load was still pending. He will need to follow up as an outpatient in the ID clinic.  HCV: Patient's last RNA level in December of 2014 was 0,786,754 but he was lost to follow up. In the hospital his LFTs were normal with AST 27, ALT 16, Alk Phos 65. He will require further outpatient follow up with the ID clinic.    Discharge Vitals:   BP 109/66 mmHg  Pulse 88  Temp(Src) 98.5 F (36.9 C) (Oral)  Resp 18  Ht _0  (1.727 m)  Wt 144 lb 3.2 oz (65.409 kg)  BMI 21.93 kg/m2  SpO2 100%  Discharge Labs:  No results found for this or any previous visit (from the past 24 hour(s)).  Signed: Luan Moore, MD 04/06/2014, 12:43 PM    Services Ordered on Discharge: none Equipment Ordered on Discharge: none

## 2014-04-03 NOTE — Progress Notes (Signed)
Subjective:  Still having some nausea but no emesis. Diarrhea is persisting, 3 loose bowel movements this morning. Patient ambulated yesterday with no major issues with dyspnea.   Objective: Vital signs in last 24 hours: Filed Vitals:   04/02/14 1016 04/02/14 1434 04/02/14 2100 04/03/14 0616  BP:  108/66 106/72 106/65  Pulse:  80 80 85  Temp:  97.8 F (36.6 C) 98.9 F (37.2 C) 98.5 F (36.9 C)  TempSrc:  Oral Oral Oral  Resp:  Height:      Weight:      SpO2: 95% 100% 65% 100%   Weight change:   Intake/Output Summary (Last 24 hours) at 04/03/14 0739 Last data filed at 04/03/14 0636  Gross per 24 hour  Intake   5965 ml  Output      0 ml  Net   5965 ml   Physical Exam: General: Thin-appearing AA male, alert, cooperative, NAD. HEENT: PERRL, EOMI. Moist mucus membranes Neck: Full range of motion without pain, supple, no lymphadenopathy or carotid bruits Lungs: Clear to ascultation bilaterally, normal work of respiration, no wheezes, rales, rhonchi, improved cough  Heart: RRR, no murmurs, gallops, or rubs Abdomen: Soft, non-tender, non-distended, BS + Extremities: No cyanosis, clubbing, or edema Neurologic: Alert & oriented X3, cranial nerves II-XII intact, strength grossly intact, sensation intact to light touch  Lab Results: Basic Metabolic Panel:  Recent Labs Lab 03/31/14 2148 04/01/14 0325 04/02/14 0610  NA  --  138 138  K  --  3.4* 3.6  CL  --  106 108  CO2  --  22 24  GLUCOSE  --  83 95  BUN  --  <5* <5*  CREATININE  --  0.75 0.79  CALCIUM  --  7.7* 8.1*  MG 1.9  --  1.9   Liver Function Tests:  Recent Labs Lab 03/31/14 1230  AST 27  ALT 16  ALKPHOS 65  BILITOT 0.4  PROT 8.3  ALBUMIN 2.8*   CBC:  Recent Labs Lab 03/31/14 1230 04/01/14 0325 04/02/14 0610  WBC 8.1 7.6 5.2  NEUTROABS 5.6  --   --   HGB 13.0 10.4* 10.5*  HCT 38.4* 31.0* 32.7*  MCV 94.6 94.5 93.7  PLT 344 283 281   Urine Drug Screen: Drugs of Abuse       Component Value Date/Time   LABOPIA NONE DETECTED 03/31/2014 2245   COCAINSCRNUR NONE DETECTED 03/31/2014 2245   LABBENZ NONE DETECTED 03/31/2014 2245   AMPHETMU NONE DETECTED 03/31/2014 2245   THCU NONE DETECTED 03/31/2014 2245   LABBARB NONE DETECTED 03/31/2014 2245     Urinalysis:  Recent Labs Lab 03/31/14 1416  COLORURINE AMBER*  LABSPEC 1.029  PHURINE 6.0  GLUCOSEU NEGATIVE  HGBUR NEGATIVE  BILIRUBINUR SMALL*  KETONESUR 15*  PROTEINUR 30*  UROBILINOGEN 1.0  NITRITE NEGATIVE  LEUKOCYTESUR SMALL*   Micro Results: Recent Results (from the past 240 hour(s))  Blood Culture (routine x 2)     Status: None (Preliminary result)   Collection Time: 03/31/14 12:30 PM  Result Value Ref Range Status   Specimen Description BLOOD LEFT ARM  Final   Special Requests BOTTLES DRAWN AEROBIC AND ANAEROBIC  Final   Culture   Final           BLOOD CULTURE RECEIVED NO GROWTH TO DATE CULTURE WILL BE HELD FOR 5 DAYS BEFORE ISSUING A FINAL NEGATIVE REPORT Performed at [facility]    Report Status PENDING  Incomplete  Urine culture     Status: None   Collection Time: 03/31/14  2:16 PM  Result Value Ref Range Status   Specimen Description URINE, CLEAN CATCH  Final   Special Requests NONE  Final   Colony Count   Final    35,000 COLONIES/ML Performed at [facility]    Culture   Final    Multiple bacterial morphotypes present, none predominant. Suggest appropriate recollection if clinically indicated. Performed at [facility]    Report Status 04/01/2014 FINAL  Final  Blood Culture (routine x 2)     Status: None   Collection Time: 03/31/14  3:00 PM  Result Value Ref Range Status   Specimen Description BLOOD LEFT FOREARM  Final   Special Requests BOTTLES DRAWN AEROBIC AND ANAEROBIC 5CC  Final   Culture   Final    BACILLUS SPECIES Note: Standardized susceptibility testing for this organism is not available. Note: Gram Stain Report Called to,Read Back By  and Verified With: JOANNE HAMZE 04/01/14 @ 12:32PM BY RUSCOE A. Performed at [facility]    Report Status 04/02/2014 FINAL  Final  Stool culture     Status: None (Preliminary result)   Collection Time: 03/31/14  8:10 PM  Result Value Ref Range Status   Specimen Description STOOL  Final   Special Requests NONE  Final   Culture   Final    NO SUSPICIOUS COLONIES, CONTINUING TO HOLD Performed at [facility]    Report Status PENDING  Incomplete  Clostridium Difficile by PCR     Status: None   Collection Time: 04/02/14  3:45 PM  Result Value Ref Range Status   C difficile by pcr NEGATIVE NEGATIVE Final   Studies/Results: No results found. Medications: I have reviewed the patient's current medications. Scheduled Meds: . azithromycin  500 mg Intravenous Q24H  . cefTRIAXone (ROCEPHIN)  IV  1 g Intravenous Q24H  . enoxaparin (LOVENOX) injection  40 mg Subcutaneous Q24H  . sulfamethoxazole-trimethoprim  320 mg Intravenous 3 times per day   Continuous Infusions: . sodium chloride 100 mL/hr at 04/02/14 1337   PRN Meds:.acetaminophen **OR** acetaminophen, ondansetron **OR** ondansetron (ZOFRAN) IV   Assessment/Plan: 24 y/o M w/ PMHx of HIV non-compliant w/ medications, presented to ED w/ N/V/D and cough, admitted for bilateral pulmonary infiltrates.   CAP: CXR with bilateral pulmonary infiltrates suggestive of community acquired pneumonia. PCP possible in the setting of non-compliance with HAART though CD4 at 280. 1/2 blood cultures positive for bacillus species, likely contaminant. Flu negative.  -ID following; appreciate recs -Continue Ceftriaxone + Azithromycin (start date of 03/31/2014) -Consider transition to PO antibiotics soon.  -Consider discontinuing bactrim given CD4 count.   HIV: Last seen in ID clinic 07/2013. Non-compliant w/ HIV medications, previously taking Stribild. CD4 of 280, VL pending.  -ID recommending close outpatient follow up for resumption of  HAART at that time.   N/V/D: Likely secondary to gastroenteritis though immunocompromised state may expand the differential of possible pathogens. C. Diff negative. Stool culture negative to date, pathogen panel pending. Patient having substantial PO intake at this point. Low likelihood of opportunistic infection given CD4 count of 280. -Regular diet -Zofran prn  HCV: Pending further management as outpatient in ID clinic though patient lost to follow up. -Further outpatient follow-up  Hypokalemia: Resolved.  -Repeat BMP in AM  DVT/PE PPx: Lovenox [place]  Dispo: Disposition is deferred at this time, awaiting improvement of current medical problems. Anticipated discharge in approximately 1-2 day(s).  The patient does have a current PCP Tobey Grim(Jeffrey H Walden, MD) and [facility] after discharge.  The patient does not have transportation limitations that hinder transportation to clinic appointments.  .Services Needed at time of discharge: Y = Yes, Blank = No PT:   OT:   RN:   Equipment:   Other:     LOS: 3 days   Harold BarbanLawrence Katelin Kutsch, MD 04/03/2014, 7:39 AM

## 2014-04-03 NOTE — Progress Notes (Signed)
[facility]        Subjective: Feeling better   Antibiotics:  Anti-infectives    Start     Dose/Rate Route Frequency Ordered Stop   04/01/14 1400  sulfamethoxazole-trimethoprim (BACTRIM) 320 mg in dextrose 5 % 500 mL IVPB     320 mg346.7 mL/hr over 90 Minutes Intravenous 3 times per day 04/01/14 1356     04/01/14 0000  azithromycin (ZITHROMAX) 500 mg in dextrose 5 % 250 mL IVPB  Status:  Discontinued     500 mg250 mL/hr over 60 Minutes Intravenous Every 24 hours 03/31/14 1736 03/31/14 1752   04/01/14 0000  cefTRIAXone (ROCEPHIN) 1 g in dextrose 5 % 50 mL IVPB  Status:  Discontinued     1 g100 mL/hr over 30 Minutes Intravenous Every 24 hours 03/31/14 1736 03/31/14 1752   03/31/14 2300  sulfamethoxazole-trimethoprim (BACTRIM) 400 mg of trimethoprim in dextrose 5 % 500 mL IVPB  Status:  Discontinued     400 mg of trimethoprim350 mL/hr over 90 Minutes Intravenous 3 times per day 03/31/14 1953 04/01/14 1356   03/31/14 2100  cefTRIAXone (ROCEPHIN) 1 g in dextrose 5 % 50 mL IVPB - Premix     1 g100 mL/hr over 30 Minutes Intravenous Every 24 hours 03/31/14 1849     03/31/14 2100  azithromycin (ZITHROMAX) 500 mg in dextrose 5 % 250 mL IVPB     500 mg250 mL/hr over 60 Minutes Intravenous Every 24 hours 03/31/14 1849     03/31/14 1500  piperacillin-tazobactam (ZOSYN) IVPB 3.375 g     3.375 g100 mL/hr over 30 Minutes Intravenous  Once 03/31/14 1447 03/31/14 1706   03/31/14 1500  ciprofloxacin (CIPRO) IVPB 400 mg     400 mg200 mL/hr over 60 Minutes Intravenous  Once 03/31/14 1447 03/31/14 1826   03/31/14 1500  sulfamethoxazole-trimethoprim (BACTRIM DS,SEPTRA DS) 800-160 MG per tablet 2 tablet     2 tablet Oral  Once 03/31/14 1447 03/31/14 1503      Medications: Scheduled Meds: . azithromycin  500 mg Intravenous Q24H  . cefTRIAXone (ROCEPHIN)  IV  1 g Intravenous Q24H  . enoxaparin (LOVENOX) injection  40 mg Subcutaneous Q24H  . sulfamethoxazole-trimethoprim   320 mg Intravenous 3 times per day   Continuous Infusions: . sodium chloride 100 mL/hr at 04/02/14 1337   PRN Meds:.acetaminophen **OR** acetaminophen, ondansetron **OR** ondansetron (ZOFRAN) IV    Objective: Weight change:   Intake/Output Summary (Last 24 hours) at 04/03/14 1350 Last data filed at 04/03/14 0900  Gross per 24 hour  Intake   6085 ml  Output      0 ml  Net   6085 ml   Blood pressure 106/65, pulse 85, temperature 98.5 F (36.9 C), temperature source Oral, resp. rate 16, height  (1.727 m), weight 144 lb 3.2 oz (65.409 kg), SpO2 100 %. Temp:  [97.8 F (36.6 C)-98.9 F (37.2 C)] 98.5 F (36.9 C) (01/11 0616) Pulse Rate:  [80-85] 85 (01/11 0616) Resp:  [16-18] 16 (01/11 0616) BP: (106-108)/(65-72) 106/65 mmHg (01/11 0616) SpO2:  [65 %-100 %] 100 % (01/11 0616)  Physical Exam: General: Alert and awake, oriented x3, not in any acute distress. HEENT:  EOMI, no thrush CVS regular rate, normal r,  no murmur rubs or gallops Chest: Fairly clear to auscultation bilaterally, no wheezing, rales or rhonchi Abdomen: soft nontender, nondistended, normal bowel sounds, Extremities: no  clubbing or edema noted bilaterally Neuro: nonfocal  CBC:  CBC Latest Ref  Rng 04/02/2014 04/01/2014 03/31/2014  WBC 4.0 - 10.5 K/uL 5.2 7.6 8.1  Hemoglobin 13.0 - 17.0 g/dL 10.5(L) 10.4(L) 13.0  Hematocrit 39.0 - 52.0 % 32.7(L) 31.0(L) 38.4(L)  Platelets 150 - 400 K/uL 281 283 344      BMET  Recent Labs  04/02/14 0610 04/03/14 0650  NA 138 133*  K 3.6 3.7  CL 108 108  CO2 24 20  GLUCOSE 95 95  BUN <5* <5*  CREATININE 0.79 0.82  CALCIUM 8.1* 8.0*     Liver Panel  No results for input(s): PROT, ALBUMIN, AST, ALT, ALKPHOS, BILITOT, BILIDIR, IBILI in the last 72 hours.     Sedimentation Rate No results for input(s): ESRSEDRATE in the last 72 hours. C-Reactive Protein No results for input(s): CRP in the last 72 hours.  Micro Results: Recent Results (from the past  720 hour(s))  Blood Culture (routine x 2)     Status: None (Preliminary result)   Collection Time: 03/31/14 12:30 PM  Result Value Ref Range Status   Specimen Description BLOOD LEFT ARM  Final   Special Requests BOTTLES DRAWN AEROBIC AND ANAEROBIC  Final   Culture   Final           BLOOD CULTURE RECEIVED NO GROWTH TO DATE CULTURE WILL BE HELD FOR 5 DAYS BEFORE ISSUING A FINAL NEGATIVE REPORT Performed at [facility]    Report Status PENDING  Incomplete  Urine culture     Status: None   Collection Time: 03/31/14  2:16 PM  Result Value Ref Range Status   Specimen Description URINE, CLEAN CATCH  Final   Special Requests NONE  Final   Colony Count   Final    35,000 COLONIES/ML Performed at [facility]    Culture   Final    Multiple bacterial morphotypes present, none predominant. Suggest appropriate recollection if clinically indicated. Performed at [facility]    Report Status 04/01/2014 FINAL  Final  Blood Culture (routine x 2)     Status: None   Collection Time: 03/31/14  3:00 PM  Result Value Ref Range Status   Specimen Description BLOOD LEFT FOREARM  Final   Special Requests BOTTLES DRAWN AEROBIC AND ANAEROBIC 5CC  Final   Culture   Final    BACILLUS SPECIES Note: Standardized susceptibility testing for this organism is not available. Note: Gram Stain Report Called to,Read Back By and Verified With: JOANNE HAMZE 04/01/14 @ 12:32PM BY RUSCOE A. Performed at [facility]    Report Status 04/02/2014 FINAL  Final  Stool culture     Status: None   Collection Time: 03/31/14  8:10 PM  Result Value Ref Range Status   Specimen Description STOOL  Final   Special Requests NONE  Final   Culture   Final    NO SALMONELLA, SHIGELLA, CAMPYLOBACTER, YERSINIA, OR E.COLI [id number] ISOLATED Performed at [facility]    Report Status 04/03/2014 FINAL  Final  Clostridium Difficile by PCR     Status: None   Collection Time: 04/02/14  3:45 PM    Result Value Ref Range Status   C difficile by pcr NEGATIVE NEGATIVE Final    Studies/Results: No results found.    Assessment/Plan:  Principal Problem:   Community acquired pneumonia Active Problems:   Protein-calorie malnutrition, severe   Diarrhea   HIV (human immunodeficiency virus infection)   Depression   Anxiety   Hepatitis C   Normocytic anemia    Jay Simon is  a 24 y.o. male with  HIV, noncompliance, now with CAP and being treated for PCP  #1 CAP +/- PCP: Given  Lab Results  Component Value Date   CD4TABS 280* 03/31/2014   CD4TABS 300* 08/09/2013   CD4TABS 320* 04/19/2013   PCP unlikely so I would DC his bactrim  He is 4 days into treatment for CAP with ceftriaxone and azithromycin, could give him one more day of parenteral therapy and then dc it, vs extend it to 7 days with oral amoxicillin  #2 HIV: The fact that he tried to "stretch out" his STRIBILD have a high degree of anxiety about the NRTI resistance as well as integrates resistance  I will send off HIV genotype today with HIV Integrase genotype  I would consider changing him to Tivicay 50mg  BID and Truvada in case he DOES have INI resistance in a regimen that is tolerable  He should also have HIV genosure archive testing done on his HIV DNA from his circulating [facility]  (but will do this in our clinic)  He is going to need ADAP done, and can try to get him urgent ADAP. I can see about having Marcelino DusterMichelle form [facility] come to the hospital, I suspect the patient will be well enough to be discharged in the next day or 2 and it may be more easy to fill out all his paperwork in our clinic  #3 Hep C genotype 1 A, Could fit Harvoni with Tivicay and Truvada ONCE he shows he can be compliant with his AR[facility]       LOS: 3 days   Acey LavCornelius Van Dam 04/03/2014, 1:50 PM

## 2014-04-03 NOTE — Progress Notes (Signed)
Subjective: Patient continues to be nauseous, however, has not had anymore episodes of vomiting. Patient does report 3 episodes of diarrhea this morning with some anal discomfort and irritation. Objective: Vital signs in last 24 hours: Filed Vitals:   04/02/14 1016 04/02/14 1434 04/02/14 2100 04/03/14 0616  BP:  108/66 106/72 106/65  Pulse:  80 80 85  Temp:  97.8 F (36.6 C) 98.9 F (37.2 C) 98.5 F (36.9 C)  TempSrc:  Oral Oral Oral  Resp:  18 18 16   Height:      Weight:      SpO2: 95% 100% 65% 100%   Weight change:   Intake/Output Summary (Last 24 hours) at 04/03/14 0833 Last data filed at 04/03/14 0636  Gross per 24 hour  Intake   5965 ml  Output      0 ml  Net   5965 ml   General: Thin-appearing AA male, alert, cooperative, NAD. HEENT: PERRL, EOMI. Moist mucus membranes Neck: Full range of motion without pain, supple, no lymphadenopathy  Lungs: Clear to ascultation bilaterally, normal work of respiration, no wheezes, rales, rhonchi Heart: RRR, no murmurs, gallops, or rubs Abdomen: Soft, non-tender, non-distended, BS + Extremities: No cyanosis, clubbing, or edema Neurologic: Alert & oriented X3, cranial nerves II-XII intact, strength grossly intact, sensation intact to light touch  Lab Results: Basic Metabolic Panel:  Recent Labs Lab 03/31/14 2148  04/02/14 0610 04/03/14 0650  NA  --   < > 138 133*  K  --   < > 3.6 3.7  CL  --   < > 108 108  CO2  --   < > 24 20  GLUCOSE  --   < > 95 95  BUN  --   < > <5* <5*  CREATININE  --   < > 0.79 0.82  CALCIUM  --   < > 8.1* 8.0*  MG 1.9  --  1.9  --   < > = values in this interval not displayed. Liver Function Tests:  Recent Labs Lab 03/31/14 1230  AST 27  ALT 16  ALKPHOS 65  BILITOT 0.4  PROT 8.3  ALBUMIN 2.8*   No results for input(s): LIPASE, AMYLASE in the last 168 hours. No results for input(s): AMMONIA in the last 168 hours. CBC:  Recent Labs Lab 03/31/14 1230 04/01/14 0325 04/02/14 0610  WBC  8.1 7.6 5.2  NEUTROABS 5.6  --   --   HGB 13.0 10.4* 10.5*  HCT 38.4* 31.0* 32.7*  MCV 94.6 94.5 93.7  PLT 344 283 281   Cardiac Enzymes: No results for input(s): CKTOTAL, CKMB, CKMBINDEX, TROPONINI in the last 168 hours. BNP: No results for input(s): PROBNP in the last 168 hours. D-Dimer: No results for input(s): DDIMER in the last 168 hours. CBG: No results for input(s): GLUCAP in the last 168 hours. Hemoglobin A1C: No results for input(s): HGBA1C in the last 168 hours. Fasting Lipid Panel: No results for input(s): CHOL, HDL, LDLCALC, TRIG, CHOLHDL, LDLDIRECT in the last 168 hours. Thyroid Function Tests: No results for input(s): TSH, T4TOTAL, FREET4, T3FREE, THYROIDAB in the last 168 hours. Coagulation: No results for input(s): LABPROT, INR in the last 168 hours. Anemia Panel: No results for input(s): VITAMINB12, FOLATE, FERRITIN, TIBC, IRON, RETICCTPCT in the last 168 hours. Urine Drug Screen: Drugs of Abuse     Component Value Date/Time   LABOPIA NONE DETECTED 03/31/2014 2245   COCAINSCRNUR NONE DETECTED 03/31/2014 2245   LABBENZ NONE DETECTED 03/31/2014 2245  AMPHETMU NONE DETECTED 03/31/2014 2245   THCU NONE DETECTED 03/31/2014 2245   LABBARB NONE DETECTED 03/31/2014 2245    Alcohol Level: No results for input(s): ETH in the last 168 hours. Urinalysis:  Recent Labs Lab 03/31/14 1416  COLORURINE AMBER*  LABSPEC 1.029  PHURINE 6.0  GLUCOSEU NEGATIVE  HGBUR NEGATIVE  BILIRUBINUR SMALL*  KETONESUR 15*  PROTEINUR 30*  UROBILINOGEN 1.0  NITRITE NEGATIVE  LEUKOCYTESUR SMALL*     Micro Results: Recent Results (from the past 240 hour(s))  Blood Culture (routine x 2)     Status: None (Preliminary result)   Collection Time: 03/31/14 12:30 PM  Result Value Ref Range Status   Specimen Description BLOOD LEFT ARM  Final   Special Requests BOTTLES DRAWN AEROBIC AND ANAEROBIC 10ML  Final   Culture   Final           BLOOD CULTURE RECEIVED NO GROWTH TO DATE  CULTURE WILL BE HELD FOR 5 DAYS BEFORE ISSUING A FINAL NEGATIVE REPORT Performed at [facility]    Report Status PENDING  Incomplete  Urine culture     Status: None   Collection Time: 03/31/14  2:16 PM  Result Value Ref Range Status   Specimen Description URINE, CLEAN CATCH  Final   Special Requests NONE  Final   Colony Count   Final    35,000 COLONIES/ML Performed at [facility]    Culture   Final    Multiple bacterial morphotypes present, none predominant. Suggest appropriate recollection if clinically indicated. Performed at [facility]    Report Status 04/01/2014 FINAL  Final  Blood Culture (routine x 2)     Status: None   Collection Time: 03/31/14  3:00 PM  Result Value Ref Range Status   Specimen Description BLOOD LEFT FOREARM  Final   Special Requests BOTTLES DRAWN AEROBIC AND ANAEROBIC 5CC  Final   Culture   Final    BACILLUS SPECIES Note: Standardized susceptibility testing for this organism is not available. Note: Gram Stain Report Called to,Read Back By and Verified With: JOANNE HAMZE 04/01/14 @ 12:32PM BY RUSCOE A. Performed at [facility]    Report Status 04/02/2014 FINAL  Final  Stool culture     Status: None (Preliminary result)   Collection Time: 03/31/14  8:10 PM  Result Value Ref Range Status   Specimen Description STOOL  Final   Special Requests NONE  Final   Culture   Final    NO SUSPICIOUS COLONIES, CONTINUING TO HOLD Performed at [facility]    Report Status PENDING  Incomplete  Clostridium Difficile by PCR     Status: None   Collection Time: 04/02/14  3:45 PM  Result Value Ref Range Status   C difficile by pcr NEGATIVE NEGATIVE Final   Studies/Results: No results found. Medications:  Scheduled Meds: . azithromycin  500 mg Intravenous Q24H  . cefTRIAXone (ROCEPHIN)  IV  1 g Intravenous Q24H  . enoxaparin (LOVENOX) injection  40 mg Subcutaneous Q24H  . sulfamethoxazole-trimethoprim  320 mg Intravenous  3 times per day   Continuous Infusions: . sodium chloride 100 mL/hr at 04/02/14 1337   PRN Meds:.acetaminophen **OR** acetaminophen, ondansetron **OR** ondansetron (ZOFRAN) IV Assessment/Plan: Principal Problem:   Community acquired pneumonia Active Problems:   Protein-calorie malnutrition, severe   Diarrhea   HIV (human immunodeficiency virus infection)   Depression   Anxiety   Hepatitis C   Normocytic anemia  Mr. Sangalang is a 24 yo man with  a past medical history significant for poorly controlled HIV and HCV who is presenting with 2 weeks of cough, fever, vomiting, diarrhea and recent onset of shortness of breath and chest pain. CXR revealed bilateral basilar infiltrates consistent with pneumonia. Continues to improve.  1. Bilateral pneumonia: Patient presented with a 2 week history of cough, fever, and recent onset shortness of breath in the context of not taking his HIV medications since June 2015 and taking them inconsistently prior to that. CXR showed bilateral basilar infiltrates projecting into the lingular and right middle lobe consistent with an acute pneumonia. SpO2 has been stable on ambulation. LDH normal. Most likely CAP, however, PCP still on differential due to non-compliance with HIV medications. Flu negative - continue azithromycin and ceftriaxone, day 4 - consider discontinuing bactrim given CD4 count 280 - 1/2 blood cultures positive for bacillus, likely contaminate   2. Nausea, vomiting, diarrhea: Patient continues to have nausea and diarrhea. Likely secondary to gastroenteritis, however, given HIV status, other etiologies possible. C. Diff negative, stool cultures negative. - zofran for nausea - GI path panel pending - NS 153m/hr  3. HIV: Patient diagnosed in 2011 and has not been consistently taking his medication. Last time he took stribild was June 2015. Last CD4 count 5/15 was 300, HIV RNA was 38,650. - HIV RNA pending - CD4 count 280 - ID following  4.  HCV: Last RNA 12/14 was 15,391,225 LFTs normal with AST 27, ALT 16, Alk Phos 65. No current issues. - further outpatient follow up  Diet: Regular Prophylaxis: lovenox Code: Full  Dispo: Disposition is deferred at this time, awaiting improvement of current medical problems. Anticipated discharge in approximately 1-2 day(s).   The patient does have a current PCP (Alveda Reasons MD) and [facility].  The patient does not have transportation limitations that hinder transportation to clinic appointments.  This is a MCareers information officerNote.  The care of the patient was discussed with Dr. NRaelene Bottand the assessment and plan formulated with their assistance.  Please see their attached note for official documentation of the daily encounter.   LOS: 3 days   HBay ParkStudent 04/03/2014, 8:33 AM

## 2014-04-04 ENCOUNTER — Other Ambulatory Visit: Payer: Self-pay | Admitting: Licensed Clinical Social Worker

## 2014-04-04 DIAGNOSIS — B20 Human immunodeficiency virus [HIV] disease: Secondary | ICD-10-CM

## 2014-04-04 LAB — LEGIONELLA ANTIGEN, URINE

## 2014-04-04 LAB — HIV-1 RNA QUANT-NO REFLEX-BLD
HIV 1 RNA Quant: 27742 copies/mL — ABNORMAL HIGH (ref <20)
HIV-1 RNA Quant, Log: 4.44 {Log} — ABNORMAL HIGH (ref <1.30)

## 2014-04-04 MED ORDER — AZITHROMYCIN 500 MG PO TABS
500.0000 mg | ORAL_TABLET | Freq: Every day | ORAL | Status: DC
  Filled 2014-04-04: qty 1

## 2014-04-04 MED ORDER — ONDANSETRON HCL 4 MG PO TABS: 4.0000 mg | ORAL_TABLET | Freq: Three times a day (TID) | ORAL | Status: DC | PRN

## 2014-04-04 MED ORDER — AMOXICILLIN 250 MG PO CAPS: 750.0000 mg | ORAL_CAPSULE | Freq: Two times a day (BID) | ORAL | Status: DC

## 2014-04-04 NOTE — Progress Notes (Signed)
Subjective:  Jay Simon is reporting no complaints this morning. Patient is very sleepy and does not want to talk overall. Patient was informed that he is return to transition to oral antibiotics and to go home today.   Objective: Vital signs in last 24 hours: Filed Vitals:   04/03/14 0616 04/03/14 1700 04/03/14 2150 04/04/14 0558  BP: 106/65 110/64 105/65 105/70  Pulse: 85 83 79 81  Temp: 98.5 F (36.9 C) 98.5 F (36.9 C) 98.3 F (36.8 C) 98.4 F (36.9 C)  TempSrc: Oral Oral Oral Oral  Resp: Height:      Weight:      SpO2: 100% 100% 100% 100%   Weight change:   Intake/Output Summary (Last 24 hours) at 04/04/14 0818 Last data filed at 04/04/14 0526  Gross per 24 hour  Intake   2668 ml  Output    650 ml  Net   2018 ml   Physical Exam: General: Thin-appearing AA male, alert, cooperative, NAD. HEENT: PERRL, EOMI. Moist mucus membranes Neck: Full range of motion without pain, supple, no lymphadenopathy or carotid bruits Lungs: Clear to ascultation bilaterally, normal work of respiration, no wheezes, rales, rhonchi, improved cough  Heart: RRR, no murmurs, gallops, or rubs Abdomen: Soft, non-tender, non-distended, BS + Extremities: No cyanosis, clubbing, or edema Neurologic: Alert & oriented X3, cranial nerves II-XII intact, strength grossly intact, sensation intact to light touch  Lab Results: Basic Metabolic Panel:  Recent Labs Lab 03/31/14 2148  04/02/14 0610 04/03/14 0650  NA  --   < > 138 133*  K  --   < > 3.6 3.7  CL  --   < > 108 108  CO2  --   < > 24 20  GLUCOSE  --   < > 95 95  BUN  --   < > <5* <5*  CREATININE  --   < > 0.79 0.82  CALCIUM  --   < > 8.1* 8.0*  MG 1.9  --  1.9  --   < > = values in this interval not displayed. Liver Function Tests:  Recent Labs Lab 03/31/14 1230  AST 27  ALT 16  ALKPHOS 65  BILITOT 0.4  PROT 8.3  ALBUMIN 2.8*   CBC:  Recent Labs Lab 03/31/14 1230 04/01/14 0325 04/02/14 0610  WBC 8.1 7.6 5.2    NEUTROABS 5.6  --   --   HGB 13.0 10.4* 10.5*  HCT 38.4* 31.0* 32.7*  MCV 94.6 94.5 93.7  PLT 344 283 281   Urine Drug Screen: Drugs of Abuse     Component Value Date/Time   LABOPIA NONE DETECTED 03/31/2014 2245   COCAINSCRNUR NONE DETECTED 03/31/2014 2245   LABBENZ NONE DETECTED 03/31/2014 2245   AMPHETMU NONE DETECTED 03/31/2014 2245   THCU NONE DETECTED 03/31/2014 2245   LABBARB NONE DETECTED 03/31/2014 2245     Urinalysis:  Recent Labs Lab 03/31/14 1416  COLORURINE AMBER*  LABSPEC 1.029  PHURINE 6.0  GLUCOSEU NEGATIVE  HGBUR NEGATIVE  BILIRUBINUR SMALL*  KETONESUR 15*  PROTEINUR 30*  UROBILINOGEN 1.0  NITRITE NEGATIVE  LEUKOCYTESUR SMALL*   Micro Results: Recent Results (from the past 240 hour(s))  Blood Culture (routine x 2)     Status: None (Preliminary result)   Collection Time: 03/31/14 12:30 PM  Result Value Ref Range Status   Specimen Description BLOOD LEFT ARM  Final   Special Requests BOTTLES DRAWN AEROBIC AND ANAEROBIC  Final  Culture   Final           BLOOD CULTURE RECEIVED NO GROWTH TO DATE CULTURE WILL BE HELD FOR 5 DAYS BEFORE ISSUING A FINAL NEGATIVE REPORT Performed at [facility]    Report Status PENDING  Incomplete  Urine culture     Status: None   Collection Time: 03/31/14  2:16 PM  Result Value Ref Range Status   Specimen Description URINE, CLEAN CATCH  Final   Special Requests NONE  Final   Colony Count   Final    35,000 COLONIES/ML Performed at [facility]    Culture   Final    Multiple bacterial morphotypes present, none predominant. Suggest appropriate recollection if clinically indicated. Performed at [facility]    Report Status 04/01/2014 FINAL  Final  Blood Culture (routine x 2)     Status: None   Collection Time: 03/31/14  3:00 PM  Result Value Ref Range Status   Specimen Description BLOOD LEFT FOREARM  Final   Special Requests BOTTLES DRAWN AEROBIC AND ANAEROBIC 5CC  Final   Culture    Final    BACILLUS SPECIES Note: Standardized susceptibility testing for this organism is not available. Note: Gram Stain Report Called to,Read Back By and Verified With: JOANNE HAMZE 04/01/14 @ 12:32PM BY RUSCOE A. Performed at [facility]    Report Status 04/02/2014 FINAL  Final  Stool culture     Status: None   Collection Time: 03/31/14  8:10 PM  Result Value Ref Range Status   Specimen Description STOOL  Final   Special Requests NONE  Final   Culture   Final    NO SALMONELLA, SHIGELLA, CAMPYLOBACTER, YERSINIA, OR E.COLI 0157:H7 ISOLATED Performed at [facility]    Report Status 04/03/2014 FINAL  Final  Clostridium Difficile by PCR     Status: None   Collection Time: 04/02/14  3:45 PM  Result Value Ref Range Status   C difficile by pcr NEGATIVE NEGATIVE Final   Studies/Results: No results found. Medications: I have reviewed the patient's current medications. Scheduled Meds: . azithromycin  500 mg Intravenous Q24H  . cefTRIAXone (ROCEPHIN)  IV  1 g Intravenous Q24H  . enoxaparin (LOVENOX) injection  40 mg Subcutaneous Q24H   Continuous Infusions:   PRN Meds:.acetaminophen **OR** acetaminophen, ondansetron **OR** ondansetron (ZOFRAN) IV   Assessment/Plan: 24 y/o M w/ PMHx of HIV non-compliant w/ medications, presented to ED w/ N/V/D and cough, admitted for bilateral pulmonary infiltrates.   CAP: Patient presenting with a cough and bilateral pulmonary infiltrates suggestive of a community-acquired pneumonia. PCP was possible in the setting of noncompliance with heart therapy although patient CD4 was 280 on this admission. Patient's blood cultures were one out of 2 positive for bacillus species likely a contaminant. -Transition from ceftriaxone and azithromycin to amoxicillin for a total course of 7 days (end date 04/06/2014) -No need for Bactrim at this point given CD4 count.  HIV: CD4 count of 280. Infectious disease with some concern that patient may develop  resistance to Stribild. HIV genotype and integrase genotype sent. Considering transition to Tivicay and Truvada.   N/V/D: Likely secondary to gastroenteritis. C. Diff negative. Stool culture negative. Patient having substantial PO intake at this point. Low likelihood of opportunistic infection given CD4 count of 280. -Regular diet -Zofran prn  HCV: Pending further management as outpatient in ID clinic though patient lost to follow up. -Further outpatient follow-up  Hypokalemia: Resolved.  -Repeat BMP in AM  DVT/PE PPx: Lovenox [facility]  Dispo: Disposition is deferred at this time, awaiting improvement of current medical problems. Anticipated discharge in approximately 1-2 day(s).   The patient does have a current PCP Tobey Grim(Jeffrey H Walden, MD) and [facility] after discharge.  The patient does not have transportation limitations that hinder transportation to clinic appointments.  .Services Needed at time of discharge: Y = Yes, Blank = No PT:   OT:   RN:   Equipment:   Other:     LOS: 4 days   Harold BarbanLawrence Jessalynn Mccowan, MD 04/04/2014, 8:18 AM

## 2014-04-04 NOTE — Progress Notes (Signed)
Patient ID: Jay Simon, male   DOB: 08/18/1990, 24 y.o.   MRN: [id number] Medicine attending discharge note: I personally examined this patient on the day of discharge and reviewed discharge planning with medical student Jay Simon and resident physician Dr. Lyman BishopLawrence Simon and I tested the accuracy of their discharge evaluation and management plan.  Clinical summary: 24 year old man with HIV/AIDS and chronic hepatitis C infection who has been poorly compliant with medications. He presents with a progressive cough over the last 2 weeks minimally productive of sputum. He has had fevers as high as 103. He has had intermittent nausea and post tussive vomiting. His current roommate also has a respiratory illness. In addition, he reported diarrhea occurring 2-3 times a day for 1 week prior to admission.  Initial exam by Dr. Loma Simon showed a thin but adequately nourished African-American man in no distress. Blood pressure 100/60, pulse 85 regular, temperature 98.2, respirations 16, oxygen saturation 100% on room air. Pertinent findings included tachycardia without murmur. Bibasilar fine rales. Abdomen nontender. No skin rash or lesion. Multiple tattoos. Tongue bar. Initial pertinent laboratory included a normal white count 8100 with 68% neutrophils 20% lymphocytes. Normal renal and hepatic function except decreased albumin. Serum LDH normal 159. A urinalysis with 7-10 white cells A chest radiograph showed patchy bibasilar infiltrates right greater than left  Hospital course: Initial antibiotic choice to cover community-acquired pneumonia with empiric addition of Bactrim to cover pneumocystis in view of his immunocompromised status and poor compliance with his antiretroviral drugs pending repeat HIV RNA and CD4 count analysis. Cultures of blood and stool obtained.  He improved rapidly on treatment with decreasing cough and ultimately resolution of his diarrhea. Cultures of blood urine and stool remained  negative except for single blood culture which grew gram-positive rods felt to be a skin contaminant. We did ask infectious disease to consult. When CD4 count came back in a reasonable range at 280, we were advised that we could stop the Bactrim. He will be transitioned to oral antibiotics to complete a planned 10 day course of antibiotics to end on January 14. He is discharged in stable condition to follow-up with his primary care physician Jay Simon and with the  [facility]ious [facility] specialists [facility]. There were no complications.

## 2014-04-04 NOTE — Progress Notes (Signed)
Subjective: Patient is feeling markedly better. He had one episode of post-tussive emesis last night. Continues to have some diarrhea. Patient was told that he would be transitioned to oral antibiotics and go home today. Objective: Vital signs in last 24 hours: Filed Vitals:   04/03/14 0616 04/03/14 1700 04/03/14 2150 04/04/14 0558  BP: 106/65 110/64 105/65 105/70  Pulse: 85 83 79 81  Temp: 98.5 F (36.9 C) 98.5 F (36.9 C) 98.3 F (36.8 C) 98.4 F (36.9 C)  TempSrc: Oral Oral Oral Oral  Resp: 16 18 18 16   Height:      Weight:      SpO2: 100% 100% 100% 100%   Weight change:   Intake/Output Summary (Last 24 hours) at 04/04/14 0858 Last data filed at 04/04/14 0526  Gross per 24 hour  Intake   2668 ml  Output    650 ml  Net   2018 ml   General: Thin-appearing AA male, alert, cooperative, NAD. HEENT: PERRL, EOMI. Moist mucus membranes, no evidence of thrush Neck: Full range of motion without pain, supple, no lymphadenopathy  Lungs: Clear to ascultation bilaterally, normal work of respiration, no wheezes, rales, rhonchi, cough is improved Heart: RRR, no murmurs, gallops, or rubs Abdomen: Soft, non-tender, non-distended, BS + Extremities: No cyanosis, clubbing, or edema Neurologic: Alert & oriented X3, cranial nerves II-XII intact, strength grossly intact, sensation intact to light touch Lab Results: Basic Metabolic Panel:  Recent Labs Lab 03/31/14 2148  04/02/14 0610 04/03/14 0650  NA  --   < > 138 133*  K  --   < > 3.6 3.7  CL  --   < > 108 108  CO2  --   < > 24 20  GLUCOSE  --   < > 95 95  BUN  --   < > <5* <5*  CREATININE  --   < > 0.79 0.82  CALCIUM  --   < > 8.1* 8.0*  MG 1.9  --  1.9  --   < > = values in this interval not displayed. Liver Function Tests:  Recent Labs Lab 03/31/14 1230  AST 27  ALT 16  ALKPHOS 65  BILITOT 0.4  PROT 8.3  ALBUMIN 2.8*   CBC:  Recent Labs Lab 03/31/14 1230 04/01/14 0325 04/02/14 0610  WBC 8.1 7.6 5.2    NEUTROABS 5.6  --   --   HGB 13.0 10.4* 10.5*  HCT 38.4* 31.0* 32.7*  MCV 94.6 94.5 93.7  PLT 344 283 281   Urine Drug Screen: Drugs of Abuse     Component Value Date/Time   LABOPIA NONE DETECTED 03/31/2014 2245   COCAINSCRNUR NONE DETECTED 03/31/2014 2245   LABBENZ NONE DETECTED 03/31/2014 2245   AMPHETMU NONE DETECTED 03/31/2014 2245   THCU NONE DETECTED 03/31/2014 2245   LABBARB NONE DETECTED 03/31/2014 2245    Alcohol Level: No results for input(s): ETH in the last 168 hours. Urinalysis:  Recent Labs Lab 03/31/14 1416  COLORURINE AMBER*  LABSPEC 1.029  PHURINE 6.0  GLUCOSEU NEGATIVE  HGBUR NEGATIVE  BILIRUBINUR SMALL*  KETONESUR 15*  PROTEINUR 30*  UROBILINOGEN 1.0  NITRITE NEGATIVE  LEUKOCYTESUR SMALL*    Micro Results: Recent Results (from the past 240 hour(s))  Blood Culture (routine x 2)     Status: None (Preliminary result)   Collection Time: 03/31/14 12:30 PM  Result Value Ref Range Status   Specimen Description BLOOD LEFT ARM  Final   Special Requests BOTTLES DRAWN AEROBIC AND  ANAEROBIC 10ML  Final   Culture   Final           BLOOD CULTURE RECEIVED NO GROWTH TO DATE CULTURE WILL BE HELD FOR 5 DAYS BEFORE ISSUING A FINAL NEGATIVE REPORT Performed at [facility]    Report Status PENDING  Incomplete  Urine culture     Status: None   Collection Time: 03/31/14  2:16 PM  Result Value Ref Range Status   Specimen Description URINE, CLEAN CATCH  Final   Special Requests NONE  Final   Colony Count   Final    35,000 COLONIES/ML Performed at [facility]    Culture   Final    Multiple bacterial morphotypes present, none predominant. Suggest appropriate recollection if clinically indicated. Performed at [facility]    Report Status 04/01/2014 FINAL  Final  Blood Culture (routine x 2)     Status: None   Collection Time: 03/31/14  3:00 PM  Result Value Ref Range Status   Specimen Description BLOOD LEFT FOREARM  Final   Special  Requests BOTTLES DRAWN AEROBIC AND ANAEROBIC 5CC  Final   Culture   Final    BACILLUS SPECIES Note: Standardized susceptibility testing for this organism is not available. Note: Gram Stain Report Called to,Read Back By and Verified With: JOANNE HAMZE 04/01/14 @ 12:32PM BY RUSCOE A. Performed at [facility]    Report Status 04/02/2014 FINAL  Final  Stool culture     Status: None   Collection Time: 03/31/14  8:10 PM  Result Value Ref Range Status   Specimen Description STOOL  Final   Special Requests NONE  Final   Culture   Final    NO SALMONELLA, SHIGELLA, CAMPYLOBACTER, YERSINIA, OR E.COLI 0157:H7 ISOLATED Performed at [facility]    Report Status 04/03/2014 FINAL  Final  Clostridium Difficile by PCR     Status: None   Collection Time: 04/02/14  3:45 PM  Result Value Ref Range Status   C difficile by pcr NEGATIVE NEGATIVE Final   Studies/Results: No results found. Medications:  Scheduled Meds: . azithromycin  500 mg Intravenous Q24H  . cefTRIAXone (ROCEPHIN)  IV  1 g Intravenous Q24H  . enoxaparin (LOVENOX) injection  40 mg Subcutaneous Q24H   Continuous Infusions:  PRN Meds:.acetaminophen **OR** acetaminophen, ondansetron **OR** ondansetron (ZOFRAN) IV Assessment/Plan: Principal Problem:   Community acquired pneumonia Active Problems:   Protein-calorie malnutrition, severe   Diarrhea   HIV (human immunodeficiency virus infection)   Depression   Anxiety   Hepatitis C   Normocytic anemia   Chronic hepatitis C without hepatic coma Jay Simon is a 24 yo man with a past medical history significant for poorly controlled HIV and HCV who is presenting with 2 weeks of cough, fever, vomiting, diarrhea and recent onset of shortness of breath and chest pain. CXR revealed bilateral basilar infiltrates consistent with pneumonia. Much improved and ready for discharge.  1. Bilateral pneumonia: Patient presented with a 2 week history of cough, fever, and recent onset  shortness of breath in the context of not taking his HIV medications since June 2015 and taking them inconsistently prior to that. CXR showed bilateral basilar infiltrates projecting into the lingular and right middle lobe consistent with an acute pneumonia. SpO2 has been stable on ambulation. LDH normal. Most likely CAP given normal LDH and CD4 count of 280. Flu negative - discontinue azithromycin and ceftriaxone and transition to oral amoxicillin for total treatment course of 7 days (end  date 04/06/14) - bactrim not indicated given CD4 count 280 - 1/2 blood cultures positive for bacillus, likely contaminate   2. Nausea, vomiting, diarrhea: Patient continues to have nausea and diarrhea. Likely secondary to gastroenteritis, however, given HIV status, other etiologies possible. C. Diff negative, stool cultures negative. - zofran for nausea - GI path panel pending - regular diet  3. HIV: Patient diagnosed in 2011 and has not been consistently taking his medication. Last time he took stribild was June 2015. Last CD4 count 5/15 was 300, HIV RNA was 38,650. Infectious disease sent HIV genotype and integrase genotype. There is concern for resistance to Stribild and should consider switching to Tivicay and Truvada. - follow up in outpatient clinic  4. HCV: Last RNA 12/14 was 4,451,460. LFTs normal with AST 27, ALT 16, Alk Phos 65. No current issues. - further outpatient follow up  Diet: Regular Prophylaxis: lovenox Code: Full  Dispo: Disposition is deferred at this time, awaiting improvement of current medical problems. Anticipated discharge in approximately 1-2 day(s).   The patient does have a current PCP Alveda Reasons, MD) and does need an [facility] hospital follow-up appointment after discharge.  The patient does not have transportation limitations that hinder transportation to clinic appointments.  This is a Careers information officer Note.  The care of the patient was discussed with Dr. Raelene Bott and the  assessment and plan formulated with their assistance.  Please see their attached note for official documentation of the daily encounter.   LOS: 4 days   [place] Student 04/04/2014, 8:58 AM

## 2014-04-04 NOTE — Discharge Instructions (Signed)
You were hospitalized for a pneumonia. We are sending you home with on amoxicillin, an antibiotic that you will take 2 times a day until 04/06/14. It is important you complete this course even if you are feeling better.  Additionally, please follow up with your infectious disease doctor so that you can be restarted on HIV medication. We will call you with your appointment once it is scheduled. Please also follow up with your family doctor, Dr. Gwendolyn GrantWalden on Apr 18, 2014 at 11:00am. Once restarted on your medications it is very important to take your medication consistently to prevent your CD4 count from decreasing and putting you at risk for various infections.  If you start to develop difficulty breathing or your vomiting and diarrhea worsen and you start developing high fevers please return to the hospital.

## 2014-04-04 NOTE — Progress Notes (Signed)
[facility]        Subjective: Feeling much better   Antibiotics:  Anti-infectives    Start     Dose/Rate Route Frequency Ordered Stop   04/04/14 2000  azithromycin (ZITHROMAX) tablet 500 mg     500 mg Oral Daily 04/04/14 1333     04/04/14 0000  amoxicillin (AMOXIL) 250 MG capsule     750 mg Oral 2 times daily 04/04/14 1152     04/01/14 1400  sulfamethoxazole-trimethoprim (BACTRIM) 320 mg in dextrose 5 % 500 mL IVPB  Status:  Discontinued     320 mg346.7 mL/hr over 90 Minutes Intravenous 3 times per day 04/01/14 1356 04/03/14 1440   04/01/14 0000  azithromycin (ZITHROMAX) 500 mg in dextrose 5 % 250 mL IVPB  Status:  Discontinued     500 mg250 mL/hr over 60 Minutes Intravenous Every 24 hours 03/31/14 1736 03/31/14 1752   04/01/14 0000  cefTRIAXone (ROCEPHIN) 1 g in dextrose 5 % 50 mL IVPB  Status:  Discontinued     1 g100 mL/hr over 30 Minutes Intravenous Every 24 hours 03/31/14 1736 03/31/14 1752   03/31/14 2300  sulfamethoxazole-trimethoprim (BACTRIM) 400 mg of trimethoprim in dextrose 5 % 500 mL IVPB  Status:  Discontinued     400 mg of trimethoprim350 mL/hr over 90 Minutes Intravenous 3 times per day 03/31/14 1953 04/01/14 1356   03/31/14 2100  cefTRIAXone (ROCEPHIN) 1 g in dextrose 5 % 50 mL IVPB - Premix     1 g100 mL/hr over 30 Minutes Intravenous Every 24 hours 03/31/14 1849     03/31/14 2100  azithromycin (ZITHROMAX) 500 mg in dextrose 5 % 250 mL IVPB  Status:  Discontinued     500 mg250 mL/hr over 60 Minutes Intravenous Every 24 hours 03/31/14 1849 04/04/14 1333   03/31/14 1500  piperacillin-tazobactam (ZOSYN) IVPB 3.375 g     3.375 g100 mL/hr over 30 Minutes Intravenous  Once 03/31/14 1447 03/31/14 1706   03/31/14 1500  ciprofloxacin (CIPRO) IVPB 400 mg     400 mg200 mL/hr over 60 Minutes Intravenous  Once 03/31/14 1447 03/31/14 1826   03/31/14 1500  sulfamethoxazole-trimethoprim (BACTRIM DS,SEPTRA DS) 800-160 MG per tablet 2 tablet     2  tablet Oral  Once 03/31/14 1447 03/31/14 1503      Medications: Scheduled Meds: . azithromycin  500 mg Oral Daily  . cefTRIAXone (ROCEPHIN)  IV  1 g Intravenous Q24H  . enoxaparin (LOVENOX) injection  40 mg Subcutaneous Q24H   Continuous Infusions:   PRN Meds:.acetaminophen **OR** acetaminophen, ondansetron **OR** ondansetron (ZOFRAN) IV    Objective: Weight change:   Intake/Output Summary (Last 24 hours) at 04/04/14 1422 Last data filed at 04/04/14 1219  Gross per 24 hour  Intake   2668 ml  Output    650 ml  Net   2018 ml   Blood pressure 105/70, pulse 81, temperature 98.4 F (36.9 C), temperature source Oral, resp. rate 16, height  (1.727 m), weight 144 lb 3.2 oz (65.409 kg), SpO2 100 %. Temp:  [98.3 F (36.8 C)-98.5 F (36.9 C)] 98.4 F (36.9 C) (01/12 0558) Pulse Rate:  [79-83] 81 (01/12 0558) Resp:  [16-18] 16 (01/12 0558) BP: (105-110)/(64-70) 105/70 mmHg (01/12 0558) SpO2:  [100 %] 100 % (01/12 0558)  Physical Exam: General: Alert and awake, oriented x3, not in any acute distress. HEENT:  EOMI, no thrush CVS regular rate, normal r,  no murmur rubs or gallops Chest:  Fairly clear to auscultation bilaterally, no wheezing, rales or rhonchi Abdomen: soft nontender, nondistended, normal bowel sounds, Extremities: no  clubbing or edema noted bilaterally Neuro: nonfocal  CBC:  CBC Latest Ref Rng 04/02/2014 04/01/2014 03/31/2014  WBC 4.0 - 10.5 K/uL 5.2 7.6 8.1  Hemoglobin 13.0 - 17.0 g/dL 10.5(L) 10.4(L) 13.0  Hematocrit 39.0 - 52.0 % 32.7(L) 31.0(L) 38.4(L)  Platelets 150 - 400 K/uL 281 283 344      BMET  Recent Labs  04/02/14 0610 04/03/14 0650  NA 138 133*  K 3.6 3.7  CL 108 108  CO2 24 20  GLUCOSE 95 95  BUN <5* <5*  CREATININE 0.79 0.82  CALCIUM 8.1* 8.0*     Liver Panel  No results for input(s): PROT, ALBUMIN, AST, ALT, ALKPHOS, BILITOT, BILIDIR, IBILI in the last 72 hours.     Sedimentation Rate No results for input(s):  ESRSEDRATE in the last 72 hours. C-Reactive Protein No results for input(s): CRP in the last 72 hours.  Micro Results: Recent Results (from the past 720 hour(s))  Blood Culture (routine x 2)     Status: None (Preliminary result)   Collection Time: 03/31/14 12:30 PM  Result Value Ref Range Status   Specimen Description BLOOD LEFT ARM  Final   Special Requests BOTTLES DRAWN AEROBIC AND ANAEROBIC 10ML  Final   Culture   Final           BLOOD CULTURE RECEIVED NO GROWTH TO DATE CULTURE WILL BE HELD FOR 5 DAYS BEFORE ISSUING A FINAL NEGATIVE REPORT Performed at [facility]    Report Status PENDING  Incomplete  Urine culture     Status: None   Collection Time: 03/31/14  2:16 PM  Result Value Ref Range Status   Specimen Description URINE, CLEAN CATCH  Final   Special Requests NONE  Final   Colony Count   Final    35,000 COLONIES/ML Performed at [facility]    Culture   Final    Multiple bacterial morphotypes present, none predominant. Suggest appropriate recollection if clinically indicated. Performed at [facility]    Report Status 04/01/2014 FINAL  Final  Blood Culture (routine x 2)     Status: None   Collection Time: 03/31/14  3:00 PM  Result Value Ref Range Status   Specimen Description BLOOD LEFT FOREARM  Final   Special Requests BOTTLES DRAWN AEROBIC AND ANAEROBIC 5CC  Final   Culture   Final    BACILLUS SPECIES Note: Standardized susceptibility testing for this organism is not available. Note: Gram Stain Report Called to,Read Back By and Verified With: JOANNE HAMZE 04/01/14 @ 12:32PM BY RUSCOE A. Performed at [facility]    Report Status 04/02/2014 FINAL  Final  Stool culture     Status: None   Collection Time: 03/31/14  8:10 PM  Result Value Ref Range Status   Specimen Description STOOL  Final   Special Requests NONE  Final   Culture   Final    NO SALMONELLA, SHIGELLA, CAMPYLOBACTER, YERSINIA, OR E.COLI 0157:H7 ISOLATED Performed at  [facility]    Report Status 04/03/2014 FINAL  Final  Clostridium Difficile by PCR     Status: None   Collection Time: 04/02/14  3:45 PM  Result Value Ref Range Status   C difficile by pcr NEGATIVE NEGATIVE Final    Studies/Results: No results found.    Assessment/Plan:  Principal Problem:   Community acquired pneumonia Active Problems:   Protein-calorie  malnutrition, severe   Diarrhea   HIV (human immunodeficiency virus infection)   Depression   Anxiety   Hepatitis C   Normocytic anemia   Chronic hepatitis C without hepatic coma    Jay Simon is a 24 y.o. male with  HIV, noncompliance, now with CAP and being treated for PCP  #1 CAP +/- PCP: Given  Lab Results  Component Value Date   CD4TABS 280* 03/31/2014   CD4TABS 300* 08/09/2013   CD4TABS 320* 04/19/2013   PCP unlikely so I would DC his bactrim  He is 5 days into treatment for CAP agree rationale to   extend it to 7 days with oral amoxicillin  #2 HIV: The fact that he tried to "stretch out" his STRIBILD have a high degree of anxiety about the NRTI resistance as well as integrates resistance  I will send off HIV genotype today with HIV Integrase genotype and we wil obtain an HIV GENOSURE ARCHIVE in clinic tomorrow  I have asked my CMA Tamika to schedule pt with one of our financial counselors to re-apply for [employer] services to cover his visits and labs and for ADAP  I will likely write rx for BID Tivicay and daily truvada for ADAP in am but this may take up to 2 weeks unless we can expedite it    #3 Hep C genotype 1 A, Could fit Harvoni with Tivicay and Truvada ONCE he shows he can be compliant with his ARVS   #4 Social situation: Much improved with his new boyfriend who is much more supportive and cares for him. She and still has not disclosed his status nor his sexuality to his family and doing either of these is some that is very difficult for him to consider. Fortunately his current  partner is very supportive.  We will arrange HSFU for him in ID clinic     LOS: 4 days   Acey Lav 04/04/2014, 2:22 PM
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
## 2014-04-05 ENCOUNTER — Other Ambulatory Visit: Payer: Self-pay

## 2014-04-05 LAB — HIV-1 RNA ULTRAQUANT REFLEX TO GENTYP+
HIV 1 RNA QUANT: 47485 {copies}/mL — AB (ref <20)
HIV-1 RNA Quant, Log: 4.68 {Log} — ABNORMAL HIGH (ref <1.30)

## 2014-04-06 LAB — CULTURE, BLOOD (ROUTINE X 2): Culture: NO GROWTH

## 2014-04-10 LAB — GI PATHOGEN PANEL BY PCR, STOOL
C difficile toxin A/B: NOT DETECTED
CAMPYLOBACTER BY PCR: NOT DETECTED
CRYPTOSPORIDIUM BY PCR: NOT DETECTED
E COLI (ETEC) LT/ST: NOT DETECTED
E COLI (STEC): NOT DETECTED
E coli 0157 by PCR: NOT DETECTED
G LAMBLIA BY PCR: POSITIVE
NOROVIRUS G1/G2: NOT DETECTED
ROTAVIRUS A BY PCR: NOT DETECTED
SALMONELLA BY PCR: NOT DETECTED
Shigella by PCR: NOT DETECTED

## 2014-04-10 LAB — MISCELLANEOUS TEST

## 2014-04-10 LAB — HLA B*5701: HLA B 5701: NEGATIVE

## 2014-04-11 LAB — HIV-1 INTEGRASE GENOTYPE
Date Viral Load Collected: NO GROWTH
VALUE LAST VIRAL LOAD: NO GROWTH

## 2014-04-17 LAB — HIV-1 GENOTYPR PLUS

## 2014-04-18 ENCOUNTER — Inpatient Hospital Stay: Payer: Self-pay | Admitting: Family Medicine

## 2014-11-09 ENCOUNTER — Encounter (HOSPITAL_COMMUNITY): Payer: Self-pay | Admitting: Emergency Medicine

## 2014-11-09 ENCOUNTER — Emergency Department (HOSPITAL_COMMUNITY)
Admission: EM | Admit: 2014-11-09 | Discharge: 2014-11-09 | Attending: Emergency Medicine | Admitting: Emergency Medicine

## 2014-11-09 ENCOUNTER — Ambulatory Visit (HOSPITAL_COMMUNITY)
Admission: EM | Admit: 2014-11-09 | Discharge: 2014-11-09 | Disposition: A | Discharge status: Home / Self Care | Payer: No Typology Code available for payment source | Source: Ambulatory Visit | Attending: Emergency Medicine | Admitting: Emergency Medicine

## 2014-11-09 DIAGNOSIS — Z87891 Personal history of nicotine dependence: Secondary | ICD-10-CM | POA: Insufficient documentation

## 2014-11-09 DIAGNOSIS — Z888 Allergy status to other drugs, medicaments and biological substances status: Secondary | ICD-10-CM | POA: Diagnosis not present

## 2014-11-09 DIAGNOSIS — Z0441 Encounter for examination and observation following alleged adult rape: Secondary | ICD-10-CM | POA: Diagnosis not present

## 2014-11-09 DIAGNOSIS — Z21 Asymptomatic human immunodeficiency virus [HIV] infection status: Secondary | ICD-10-CM | POA: Insufficient documentation

## 2014-11-09 DIAGNOSIS — Z792 Long term (current) use of antibiotics: Secondary | ICD-10-CM | POA: Insufficient documentation

## 2014-11-09 DIAGNOSIS — Z8619 Personal history of other infectious and parasitic diseases: Secondary | ICD-10-CM | POA: Diagnosis not present

## 2014-11-09 DIAGNOSIS — B20 Human immunodeficiency virus [HIV] disease: Secondary | ICD-10-CM | POA: Diagnosis not present

## 2014-11-09 DIAGNOSIS — Y998 Other external cause status: Secondary | ICD-10-CM | POA: Insufficient documentation

## 2014-11-09 DIAGNOSIS — M542 Cervicalgia: Secondary | ICD-10-CM | POA: Diagnosis not present

## 2014-11-09 DIAGNOSIS — T7421XA Adult sexual abuse, confirmed, initial encounter: Secondary | ICD-10-CM | POA: Insufficient documentation

## 2014-11-09 DIAGNOSIS — Y9389 Activity, other specified: Secondary | ICD-10-CM | POA: Insufficient documentation

## 2014-11-09 DIAGNOSIS — Y9289 Other specified places as the place of occurrence of the external cause: Secondary | ICD-10-CM | POA: Insufficient documentation

## 2014-11-09 MED ORDER — METRONIDAZOLE 500 MG PO TABS
ORAL_TABLET | ORAL | Status: AC
  Administered 2014-11-09: 2000 mg
  Filled 2014-11-09: qty 4

## 2014-11-09 MED ORDER — PROMETHAZINE HCL 25 MG PO TABS
ORAL_TABLET | ORAL | Status: AC
  Administered 2014-11-09: 75 mg
  Filled 2014-11-09: qty 3

## 2014-11-09 MED ORDER — ULIPRISTAL ACETATE 30 MG PO TABS
ORAL_TABLET | ORAL | Status: AC
  Administered 2014-11-09: 30 mg
  Filled 2014-11-09: qty 1

## 2014-11-09 MED ORDER — CEFIXIME 400 MG PO CAPS
ORAL_CAPSULE | ORAL | Status: AC
  Administered 2014-11-09: 400 mg
  Filled 2014-11-09: qty 1

## 2014-11-09 MED ORDER — MORPHINE SULFATE (PF) 4 MG/ML IV SOLN
4.0000 mg | Freq: Once | INTRAVENOUS | Status: AC
  Administered 2014-11-09: 4 mg via INTRAMUSCULAR
  Filled 2014-11-09: qty 1

## 2014-11-09 MED ORDER — AZITHROMYCIN 1 G PO PACK
PACK | ORAL | Status: AC
  Administered 2014-11-09: 1 g
  Filled 2014-11-09: qty 1

## 2014-11-09 NOTE — ED Notes (Signed)
Secretary paged SANE nurse.

## 2014-11-09 NOTE — ED Notes (Signed)
SANE RN at bedside.
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
## 2014-11-09 NOTE — ED Notes (Signed)
Mardella Layman SANE RN states she will be here in one hour.
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
## 2014-11-09 NOTE — ED Notes (Signed)
Dr. Ward at bedside.

## 2015-05-10 ENCOUNTER — Other Ambulatory Visit: Payer: Self-pay

## 2015-05-17 ENCOUNTER — Other Ambulatory Visit (INDEPENDENT_AMBULATORY_CARE_PROVIDER_SITE_OTHER): Payer: Self-pay

## 2015-05-17 DIAGNOSIS — Z113 Encounter for screening for infections with a predominantly sexual mode of transmission: Secondary | ICD-10-CM

## 2015-05-17 DIAGNOSIS — Z79899 Other long term (current) drug therapy: Secondary | ICD-10-CM

## 2015-05-17 DIAGNOSIS — B20 Human immunodeficiency virus [HIV] disease: Secondary | ICD-10-CM

## 2015-05-17 LAB — COMPREHENSIVE METABOLIC PANEL
ALK PHOS: 73 U/L (ref 40–115)
ALT: 29 U/L (ref 9–46)
AST: 33 U/L (ref 10–40)
Albumin: 3.8 g/dL (ref 3.6–5.1)
BILIRUBIN TOTAL: 0.5 mg/dL (ref 0.2–1.2)
BUN: 8 mg/dL (ref 7–25)
CO2: 24 mmol/L (ref 20–31)
Calcium: 9 mg/dL (ref 8.6–10.3)
Chloride: 102 mmol/L (ref 98–110)
Creat: 0.72 mg/dL (ref 0.60–1.35)
GLUCOSE: 80 mg/dL (ref 65–99)
Potassium: 4 mmol/L (ref 3.5–5.3)
Sodium: 136 mmol/L (ref 135–146)
Total Protein: 8 g/dL (ref 6.1–8.1)

## 2015-05-17 LAB — CBC WITH DIFFERENTIAL/PLATELET
BASOS ABS: 0 10*3/uL (ref 0.0–0.1)
BASOS PCT: 0 % (ref 0–1)
EOS ABS: 0.2 10*3/uL (ref 0.0–0.7)
Eosinophils Relative: 4 % (ref 0–5)
HCT: 42.4 % (ref 39.0–52.0)
Hemoglobin: 13.9 g/dL (ref 13.0–17.0)
Lymphocytes Relative: 38 % (ref 12–46)
Lymphs Abs: 1.7 10*3/uL (ref 0.7–4.0)
MCH: 30.6 pg (ref 26.0–34.0)
MCHC: 32.8 g/dL (ref 30.0–36.0)
MCV: 93.4 fL (ref 78.0–100.0)
MPV: 11.2 fL (ref 8.6–12.4)
Monocytes Absolute: 0.6 10*3/uL (ref 0.1–1.0)
Monocytes Relative: 13 % — ABNORMAL HIGH (ref 3–12)
Neutro Abs: 2 10*3/uL (ref 1.7–7.7)
Neutrophils Relative %: 45 % (ref 43–77)
PLATELETS: 198 10*3/uL (ref 150–400)
RBC: 4.54 MIL/uL (ref 4.22–5.81)
RDW: 13.6 % (ref 11.5–15.5)
WBC: 4.4 10*3/uL (ref 4.0–10.5)

## 2015-05-17 LAB — LIPID PANEL
CHOL/HDL RATIO: 3.5 ratio (ref <=5.0)
Cholesterol: 145 mg/dL (ref 125–200)
HDL: 41 mg/dL (ref >=40)
LDL CALC: 88 mg/dL (ref <130)
Triglycerides: 81 mg/dL (ref <150)
VLDL: 16 mg/dL (ref <30)

## 2015-05-18 LAB — RPR TITER: RPR Titer: 1:32 {titer} — AB

## 2015-05-18 LAB — URINE CYTOLOGY ANCILLARY ONLY
Chlamydia: NEGATIVE
Neisseria Gonorrhea: NEGATIVE

## 2015-05-18 LAB — T-HELPER CELL (CD4) - (RCID CLINIC ONLY)
CD4 T CELL ABS: 210 /uL — AB (ref 400–2700)
CD4 T CELL HELPER: 12 % — AB (ref 33–55)

## 2015-05-18 LAB — RPR: RPR: REACTIVE — AB

## 2015-05-21 ENCOUNTER — Encounter: Payer: Self-pay | Admitting: *Deleted

## 2015-05-21 ENCOUNTER — Telehealth: Payer: Self-pay | Admitting: *Deleted

## 2015-05-21 LAB — HIV-1 RNA QUANT-NO REFLEX-BLD
HIV 1 RNA QUANT: 19011 {copies}/mL — AB (ref <20)
HIV-1 RNA Quant, Log: 4.28 Log copies/mL — ABNORMAL HIGH (ref <1.30)

## 2015-05-21 LAB — FLUORESCENT TREPONEMAL AB(FTA)-IGG-BLD: FLUORESCENT TREPONEMAL ABS: REACTIVE — AB

## 2015-05-21 NOTE — Telephone Encounter (Signed)
RN left message for patient to call regarding lab results. Andree Coss, RN

## 2015-05-21 NOTE — Telephone Encounter (Signed)
-----   Message from Gardiner Barefoot, MD sent at 05/21/2015 12:10 PM EST ----- New syphilis.  Needs weekly bicillin x 3 weeks. thanks

## 2015-05-22 ENCOUNTER — Telehealth: Payer: Self-pay | Admitting: *Deleted

## 2015-05-22 NOTE — Telephone Encounter (Signed)
-----   Message from Robert W Comer, MD sent at 05/21/2015 12:10 PM EST ----- New syphilis.  Needs weekly bicillin x 3 weeks. thanks 

## 2015-05-22 NOTE — Telephone Encounter (Signed)
Patient has been called, voice mail left and notified via MYchart that he needs to schedule an appt. He has a positive RPR and is Dr. Feliz Beam patient. Jay Simon

## 2015-05-24 ENCOUNTER — Ambulatory Visit: Payer: Self-pay | Admitting: Internal Medicine

## 2015-05-24 LAB — HLA B*5701: HLA-B 5701 W/RFLX HLA-B HIGH: NEGATIVE

## 2015-07-13 ENCOUNTER — Emergency Department (HOSPITAL_COMMUNITY)
Admission: EM | Admit: 2015-07-13 | Discharge: 2015-07-13 | Disposition: A | Discharge status: Home / Self Care | Payer: Self-pay | Attending: Emergency Medicine | Admitting: Emergency Medicine

## 2015-07-13 ENCOUNTER — Encounter (HOSPITAL_COMMUNITY): Payer: Self-pay | Admitting: *Deleted

## 2015-07-13 DIAGNOSIS — K625 Hemorrhage of anus and rectum: Secondary | ICD-10-CM | POA: Insufficient documentation

## 2015-07-13 DIAGNOSIS — Z87891 Personal history of nicotine dependence: Secondary | ICD-10-CM | POA: Insufficient documentation

## 2015-07-13 DIAGNOSIS — B20 Human immunodeficiency virus [HIV] disease: Secondary | ICD-10-CM | POA: Insufficient documentation

## 2015-07-13 DIAGNOSIS — K640 First degree hemorrhoids: Secondary | ICD-10-CM | POA: Insufficient documentation

## 2015-07-13 HISTORY — DX: Unspecified hemorrhoids: K64.9

## 2015-07-13 MED ORDER — POLYETHYLENE GLYCOL 3350 17 GM/SCOOP PO POWD: 17.0000 g | Freq: Two times a day (BID) | ORAL | Status: DC

## 2015-07-13 MED ORDER — IBUPROFEN 800 MG PO TABS: 800.0000 mg | ORAL_TABLET | Freq: Three times a day (TID) | ORAL | Status: DC

## 2015-07-13 MED ORDER — DOCUSATE SODIUM 100 MG PO CAPS: 100.0000 mg | ORAL_CAPSULE | Freq: Two times a day (BID) | ORAL | Status: DC

## 2015-07-13 MED ORDER — IBUPROFEN 800 MG PO TABS
800.0000 mg | ORAL_TABLET | Freq: Once | ORAL | Status: AC
  Administered 2015-07-13: 800 mg via ORAL
  Filled 2015-07-13: qty 1

## 2015-07-13 MED ORDER — LIDOCAINE HCL 2 % EX GEL
1.0000 "application " | Freq: Once | CUTANEOUS | Status: AC
  Administered 2015-07-13: 1 via TOPICAL
  Filled 2015-07-13: qty 20

## 2015-07-13 NOTE — Discharge Instructions (Signed)
Hemorrhoids Hemorrhoids are swollen veins around the rectum or anus. There are two types of hemorrhoids:   Internal hemorrhoids. These occur in the veins just inside the rectum. They may poke through to the outside and become irritated and painful.  External hemorrhoids. These occur in the veins outside the anus and can be felt as a painful swelling or hard lump near the anus. CAUSES  Pregnancy.   Obesity.   Constipation or diarrhea.   Straining to have a bowel movement.   Sitting for long periods on the toilet.  Heavy lifting or other activity that caused you to strain.  Anal intercourse. SYMPTOMS   Pain.   Anal itching or irritation.   Rectal bleeding.   Fecal leakage.   Anal swelling.   One or more lumps around the anus.  DIAGNOSIS  Your caregiver may be able to diagnose hemorrhoids by visual examination. Other examinations or tests that may be performed include:   Examination of the rectal area with a gloved hand (digital rectal exam).   Examination of anal canal using a small tube (scope).   A blood test if you have lost a significant amount of blood.  A test to look inside the colon (sigmoidoscopy or colonoscopy). TREATMENT Most hemorrhoids can be treated at home. However, if symptoms do not seem to be getting better or if you have a lot of rectal bleeding, your caregiver may perform a procedure to help make the hemorrhoids get smaller or remove them completely. Possible treatments include:   Placing a rubber band at the base of the hemorrhoid to cut off the circulation (rubber band ligation).   Injecting a chemical to shrink the hemorrhoid (sclerotherapy).   Using a tool to burn the hemorrhoid (infrared light therapy).   Surgically removing the hemorrhoid (hemorrhoidectomy).   Stapling the hemorrhoid to block blood flow to the tissue (hemorrhoid stapling).  HOME CARE INSTRUCTIONS   Eat foods with fiber, such as whole grains, beans,  nuts, fruits, and vegetables. Ask your doctor about taking products with added fiber in them (fibersupplements).  Increase fluid intake. Drink enough water and fluids to keep your urine clear or pale yellow.   Exercise regularly.   Go to the bathroom when you have the urge to have a bowel movement. Do not wait.   Avoid straining to have bowel movements.   Keep the anal area dry and clean. Use wet toilet paper or moist towelettes after a bowel movement.   Medicated creams and suppositories may be used or applied as directed.   Only take over-the-counter or prescription medicines as directed by your caregiver.   Take warm sitz baths for 15-20 minutes, 3-4 times a day to ease pain and discomfort.   Place ice packs on the hemorrhoids if they are tender and swollen. Using ice packs between sitz baths may be helpful.   Put ice in a plastic bag.   Place a towel between your skin and the bag.   Leave the ice on for 15-20 minutes, 3-4 times a day.   Do not use a donut-shaped pillow or sit on the toilet for long periods. This increases blood pooling and pain.  SEEK MEDICAL CARE IF:  You have increasing pain and swelling that is not controlled by treatment or medicine.  You have uncontrolled bleeding.  You have difficulty or you are unable to have a bowel movement.  You have pain or inflammation outside the area of the hemorrhoids. MAKE SURE YOU:  Understand these instructions.    Will watch your condition.  Will get help right away if you are not doing well or get worse.   This information is not intended to replace advice given to you by your health care provider. Make sure you discuss any questions you have with your health care provider.   Document Released: 03/07/2000 Document Revised: 02/25/2012 Document Reviewed: 01/13/2012 Elsevier Interactive Patient Education 2016 Elsevier Inc[employer]    Anal Fissure, Adult An anal fissure is a small tear or crack in the skin  around the anus. Bleeding from a fissure usually stops on its own within a few minutes. However, bleeding will often occur again with each bowel movement until the crack heals. CAUSES This condition may be caused by:  Passing large, hard stool (feces).  Frequent diarrhea.  Constipation.  Inflammatory bowel disease (Crohn disease or ulcerative colitis).  Infections.  Anal sex. SYMPTOMS Symptoms of this condition include:  Bleeding from the rectum.  Small amounts of blood seen on your stool, on toilet paper, or in the toilet after a bowel movement.  Painful bowel movements.  Itching or irritation around the anus. DIAGNOSIS A health care provider may diagnose this condition by closely examining the anal area. An anal fissure can usually be seen with careful inspection. In some cases, a rectal exam may be performed, or a short tube (anoscope) may be used to examine the anal canal. TREATMENT Treatment for this condition may include:  Taking steps to avoid constipation. This may include making changes to your diet, such as increasing your intake of fiber or fluid.  Taking fiber supplements. These supplements can soften your stool to help make bowel movements easier. Your health care provider may also prescribe a stool softener if your stool is often hard.  Taking sitz baths. This may help to heal the tear.  Using medicated creams or ointments. These may be prescribed to lessen discomfort. HOME CARE INSTRUCTIONS Eating and Drinking  Avoid foods that may be constipating, such as bananas and dairy products.  Drink enough fluid to keep your urine clear or pale yellow.  Maintain a diet that is high in fruits, whole grains, and vegetables. General Instructions  Keep the anal area as clean and dry as possible.  Take sitz baths as told by your health care provider. Do not use soap in the sitz baths.  Take over-the-counter and prescription medicines only as told by your health  care provider.  Use creams or ointments only as told by your health care provider.  Keep all follow-up visits as told by your health care provider. This is important. SEEK MEDICAL CARE IF:  You have more bleeding.  You have a fever.  You have diarrhea that is mixed with blood.  You continue to have pain.  Your problem is getting worse rather than better.   This information is not intended to replace advice given to you by your health care provider. Make sure you discuss any questions you have with your health care provider.   Document Released: 03/10/2005 Document Revised: 11/29/2014 Document Reviewed: 06/05/2014 Elsevier Interactive Patient Education 2016 Elsevier Inc.  

## 2015-07-13 NOTE — ED Provider Notes (Signed)
CSN: [id number]     Arrival date & time 07/13/15  3086 History   First MD Initiated Contact with Patient 07/13/15 [phone]     Chief Complaint  Patient presents with  . Rectal Bleeding  . Abdominal Pain     (Consider location/radiation/quality/duration/timing/severity/associated sxs/prior Treatment) HPI Comments: Patient presents to the emergency department with chief complaint of rectal bleeding and rectal pain. Patient states the symptoms started on Wednesday. He reports seeing bright red blood per rectum. It is not a heavy amount. Patient practices anal receptive intercourse. States that the symptoms started after having a course. He states that the symptoms are worsened with bowel movements, and when sitting. He has not tried taking anything to alleviate his symptoms. There are no other modifying factors. He denies any fevers, chills, nausea, vomiting, diarrhea, constipation.  The history is provided by the patient. No language interpreter was used.    Past Medical History  Diagnosis Date  . HIV disease (HCC) 2011  . Syphilis 2014  . Anal condyloma   . Diarrhea   . Hemorrhoids    Past Surgical History  Procedure Laterality Date  . Colon surgery     Family History  Problem Relation Age of Onset  . Diabetes Mother   . Diabetes Maternal Grandmother   . Diabetes Maternal Grandfather   . Cancer Maternal Grandmother     breast   Social History  Substance Use Topics  . Smoking status: Former Smoker    Types: Cigarettes  . Smokeless tobacco: Never Used  . Alcohol Use: Yes     Comment: occasionally    Review of Systems  Constitutional: Negative for fever and chills.  Respiratory: Negative for shortness of breath.   Cardiovascular: Negative for chest pain.  Gastrointestinal: Positive for anal bleeding. Negative for nausea, vomiting, diarrhea and constipation.  Genitourinary: Negative for dysuria.  All other systems reviewed and are negative.     Allergies  Risperdal;  Shellfish allergy; and Stribild  Home Medications   Prior to Admission medications   Medication Sig Start Date End Date Taking? Authorizing Provider  amoxicillin (AMOXIL) 250 MG capsule Take 3 capsules (750 mg total) by mouth 2 (two) times daily. Patient not taking: Reported on 07/13/2015 04/04/14   Harold Barban, MD  docusate sodium (COLACE) 100 MG capsule Take 1 capsule (100 mg total) by mouth every 12 (twelve) hours. 07/13/15   Roxy Horseman, PA-C  ibuprofen (ADVIL,MOTRIN) 800 MG tablet Take 1 tablet (800 mg total) by mouth 3 (three) times daily. 07/13/15   Roxy Horseman, PA-C  ondansetron (ZOFRAN) 4 MG tablet Take 1 tablet (4 mg total) by mouth every 8 (eight) hours as needed for nausea or vomiting. Patient not taking: Reported on 07/13/2015 04/04/14   Harold Barban, MD  polyethylene glycol powder (GLYCOLAX/MIRALAX) powder Take 17 g by mouth 2 (two) times daily. Until daily soft stools  OTC 07/13/15   Roxy Horseman, PA-C   BP 103/66 mmHg  Pulse 54  Temp(Src) 97.8 F (36.6 C) (Oral)  Resp 16  Ht  (1.702 m)  Wt 77.565 kg  BMI 26.78 kg/m2  SpO2 97% Physical Exam  Constitutional: He is oriented to person, place, and time. He appears well-developed and well-nourished.  HENT:  Head: Normocephalic and atraumatic.  Eyes: Conjunctivae and EOM are normal. Pupils are equal, round, and reactive to light. Right eye exhibits no discharge. Left eye exhibits no discharge. No scleral icterus.  Neck: Normal range of motion. Neck supple. No JVD present.  Cardiovascular:  Normal rate, regular rhythm and normal heart sounds.  Exam reveals no gallop and no friction rub.   No murmur heard. Pulmonary/Chest: Effort normal and breath sounds normal. No respiratory distress. He has no wheezes. He has no rales. He exhibits no tenderness.  Abdominal: Soft. He exhibits no distension and no mass. There is no tenderness. There is no rebound and no guarding.  No focal abdominal tenderness, no RLQ tenderness or  pain at McBurney's point, no RUQ tenderness or Murphy's sign, no left-sided abdominal tenderness, no fluid wave, or signs of peritonitis   Genitourinary:  Chaperone present for DRE No gross blood Internal and external hemorrhoids, with questionable  small anal fissure No abscess  Musculoskeletal: Normal range of motion. He exhibits no edema or tenderness.  Neurological: He is alert and oriented to person, place, and time.  Skin: Skin is warm and dry.  Psychiatric: He has a normal mood and affect. His behavior is normal. Judgment and thought content normal.  Nursing note and vitals reviewed.   ED Course  Procedures (including critical care time)   MDM   Final diagnoses:  Rectal bleeding  First degree hemorrhoids    Patient with rectal pain/bleeding. Bleeding is controlled. Started after anal receptive intercourse. Possible small anal fissure, also internal and external hemorrhoids. No focal abdominal tenderness. Vital signs are stable. Patient is well-appearing. Will discharge with stool softener, and instructions to use sitz bath's, and NSAIDs. Patient stands and agrees with plan. He is stable and ready for discharge.   Roxy Horsemanobert Valoria Tamburri, PA-C 07/13/15 [phone]  Melene Planan Floyd, DO 07/13/15 [phone]

## 2015-07-13 NOTE — ED Notes (Signed)
Pt states abdominal pain since Wed and rectal bleeding (not bloody stool) since yesterday.

## 2015-07-16 NOTE — ED Notes (Signed)
Pt needed note to return to work without restrictions. Called to flow management, who told me to print out the note for the patient. Note made for pt to return to work today with no restrictions.

## 2015-07-25 IMAGING — CR DG CHEST 2V
2 series · 2 of 2 positions shown · non-contrast
Comparison: None.

CLINICAL DATA: Chest pain with fever, nausea/vomiting

EXAM:
CHEST  2 VIEW

[w chest pa]
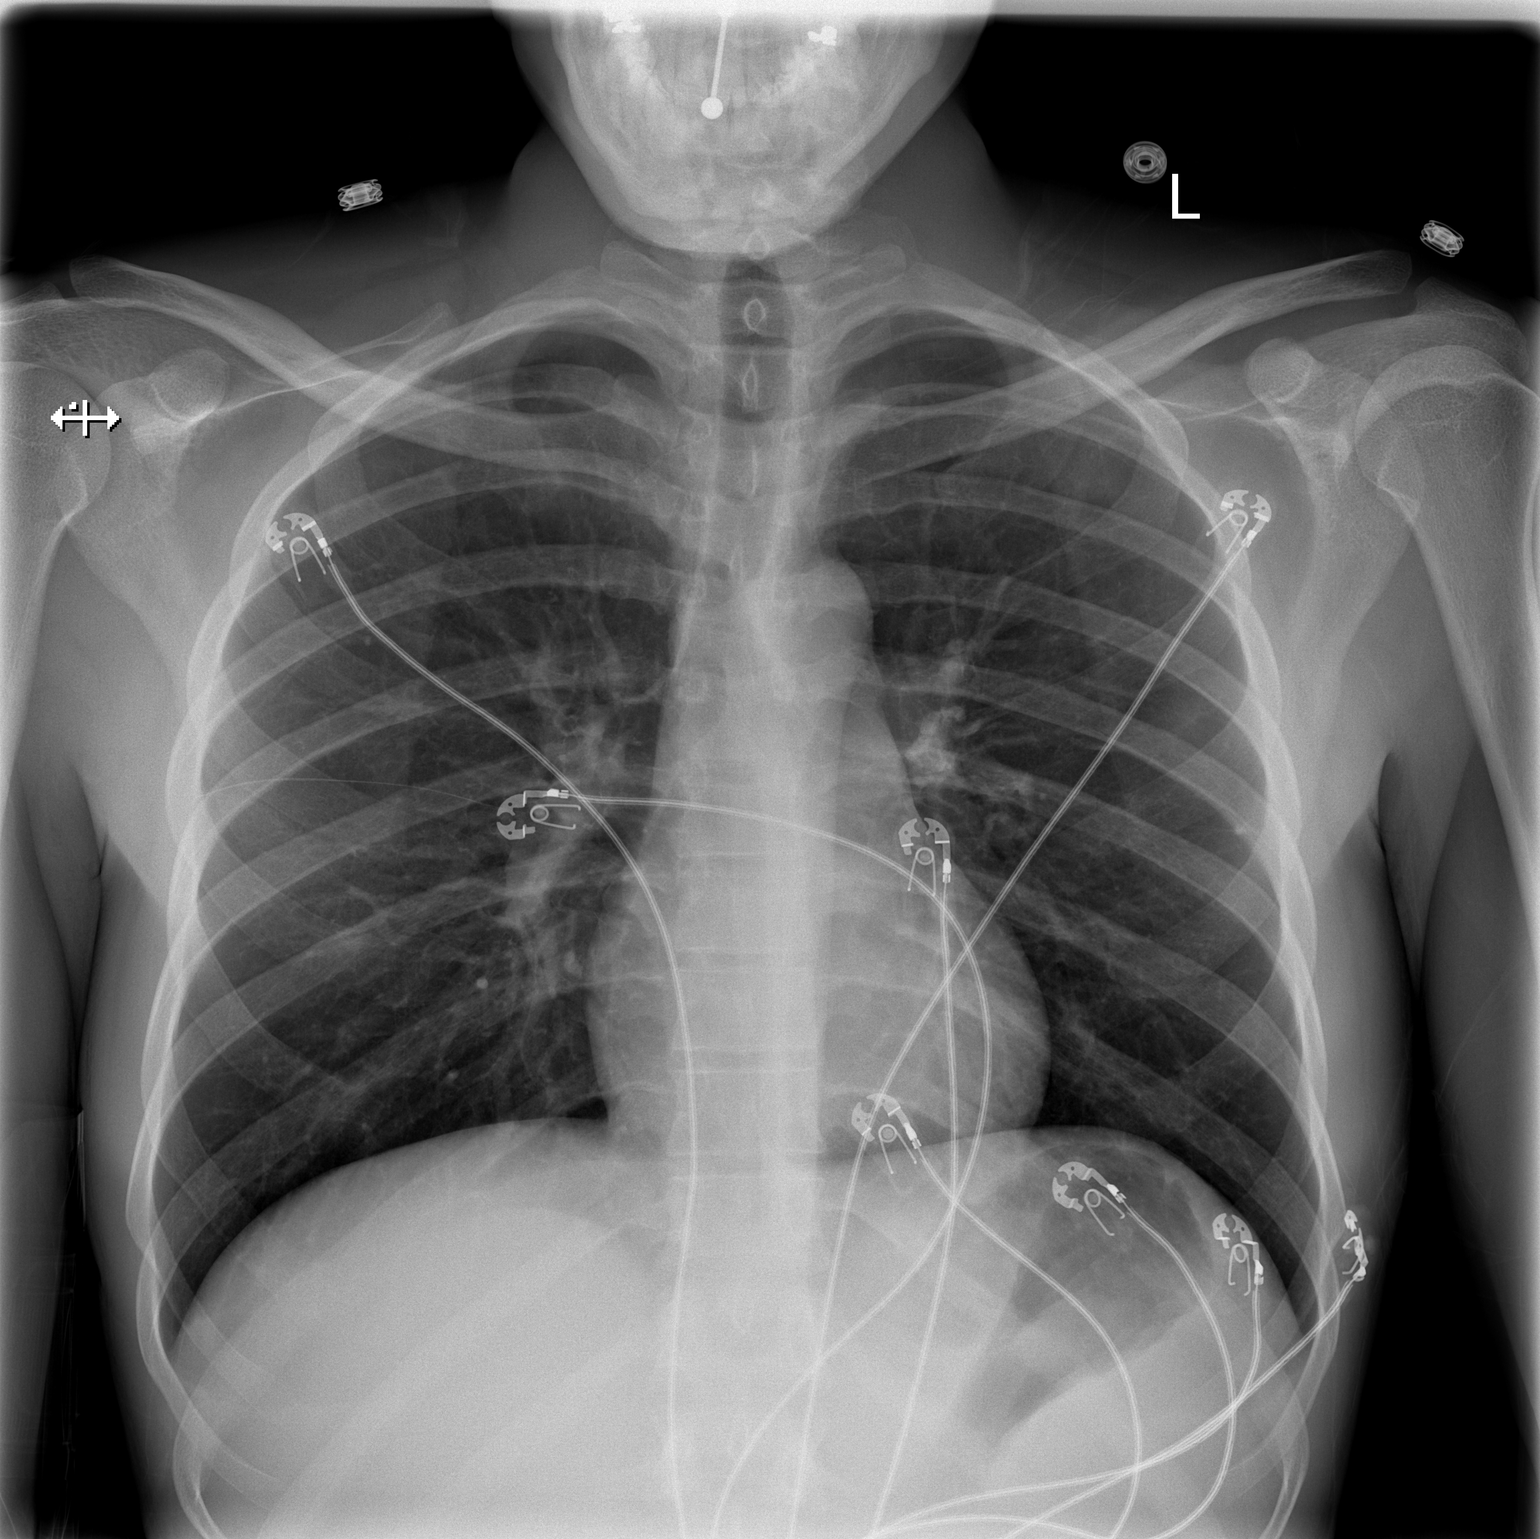

[w chest lat]
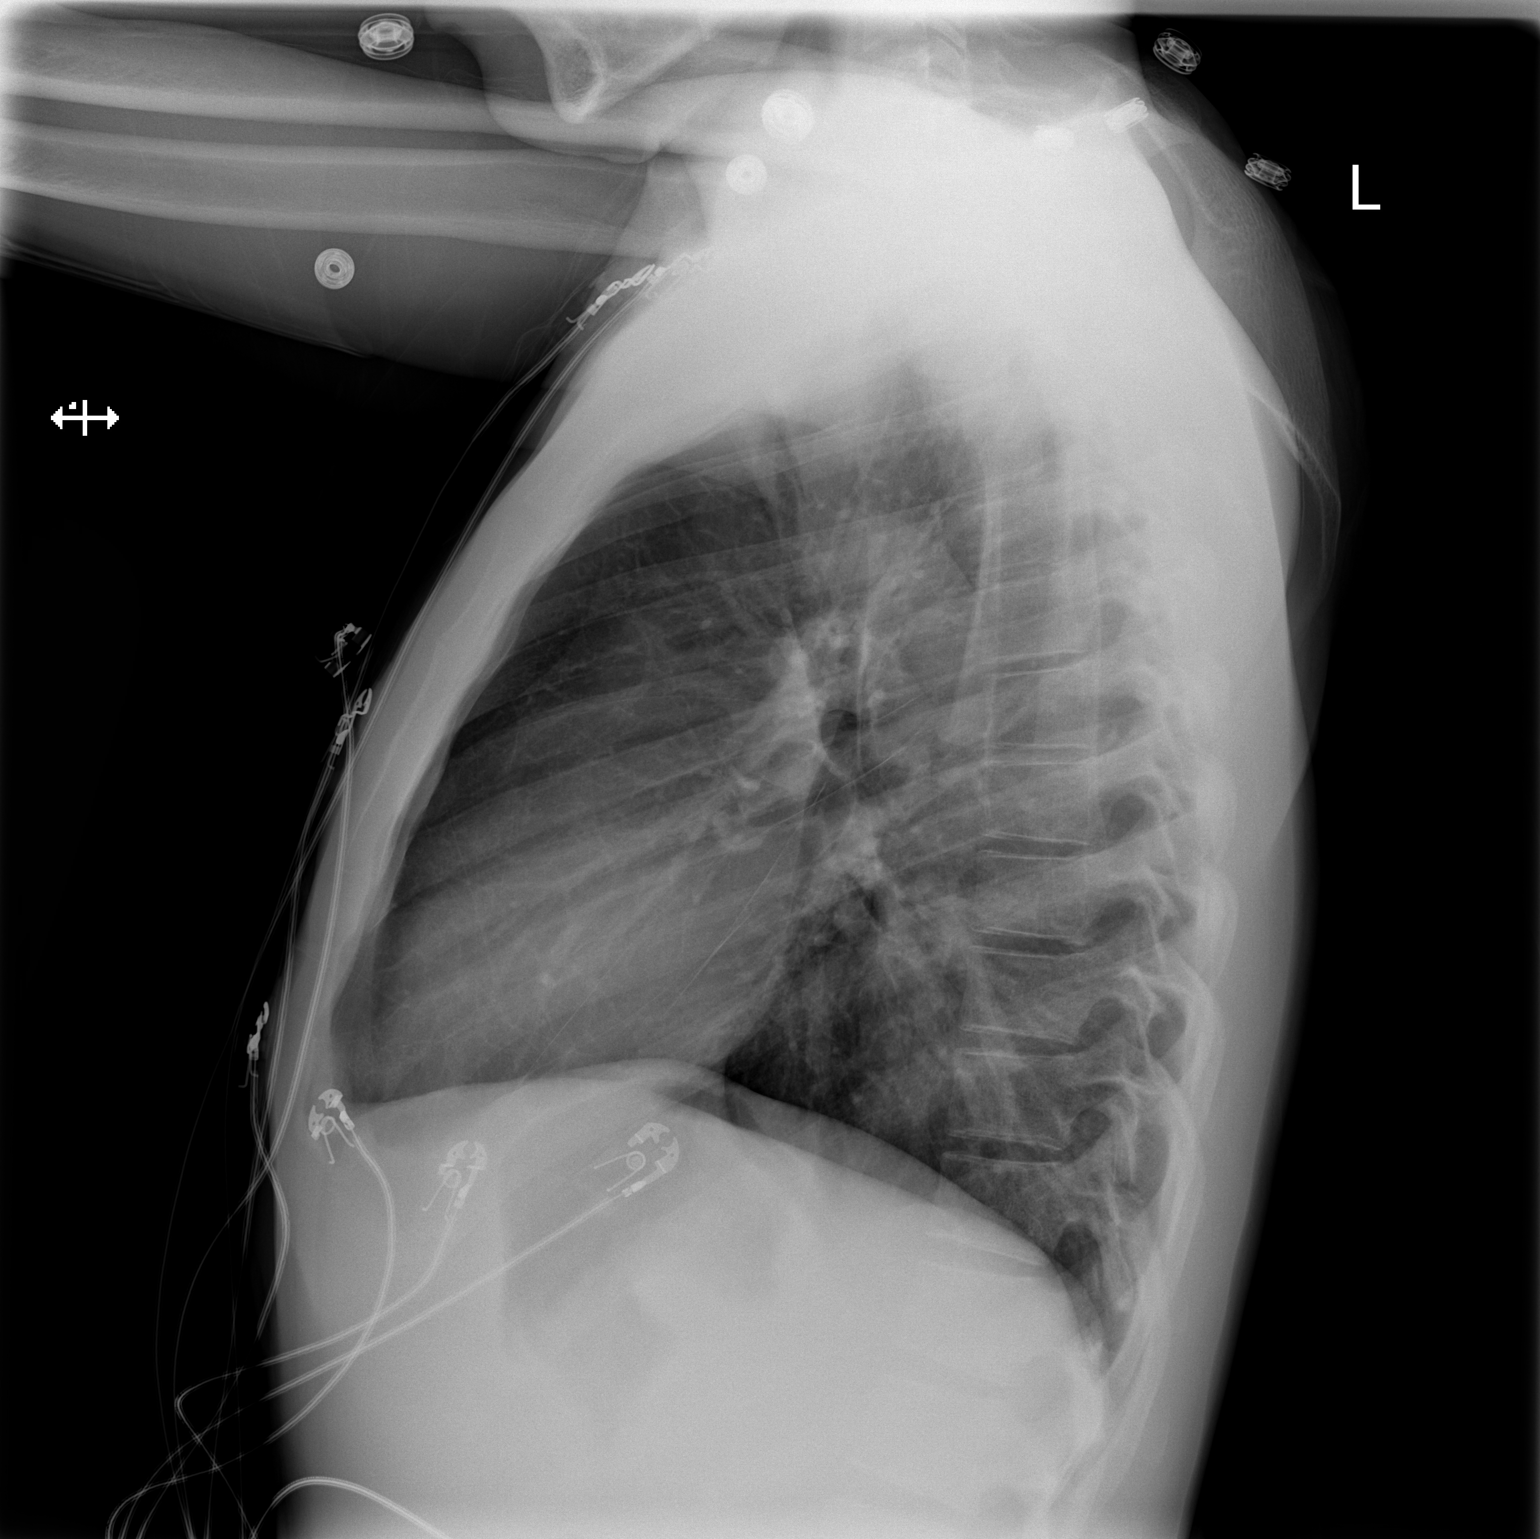

[2 of 2 positions shown; findings below may reference images not displayed]

FINDINGS: Lungs are clear. No pleural effusion or pneumothorax.

The heart is normal in size.

Visualized osseous structures are within normal limits.
IMPRESSION: Normal chest radiographs.

## 2015-07-31 ENCOUNTER — Encounter: Payer: Self-pay | Admitting: Internal Medicine

## 2015-08-23 ENCOUNTER — Emergency Department (HOSPITAL_COMMUNITY)
Admission: EM | Admit: 2015-08-23 | Discharge: 2015-08-23 | Disposition: A | Discharge status: Home / Self Care | Payer: Self-pay | Attending: Emergency Medicine | Admitting: Emergency Medicine

## 2015-08-23 ENCOUNTER — Encounter (HOSPITAL_COMMUNITY): Payer: Self-pay

## 2015-08-23 ENCOUNTER — Emergency Department (HOSPITAL_COMMUNITY): Payer: Self-pay

## 2015-08-23 DIAGNOSIS — N50812 Left testicular pain: Secondary | ICD-10-CM

## 2015-08-23 DIAGNOSIS — Z792 Long term (current) use of antibiotics: Secondary | ICD-10-CM | POA: Insufficient documentation

## 2015-08-23 DIAGNOSIS — Z87891 Personal history of nicotine dependence: Secondary | ICD-10-CM | POA: Insufficient documentation

## 2015-08-23 DIAGNOSIS — N453 Epididymo-orchitis: Secondary | ICD-10-CM | POA: Insufficient documentation

## 2015-08-23 DIAGNOSIS — R112 Nausea with vomiting, unspecified: Secondary | ICD-10-CM | POA: Insufficient documentation

## 2015-08-23 DIAGNOSIS — Z791 Long term (current) use of non-steroidal anti-inflammatories (NSAID): Secondary | ICD-10-CM | POA: Insufficient documentation

## 2015-08-23 DIAGNOSIS — Z79899 Other long term (current) drug therapy: Secondary | ICD-10-CM | POA: Insufficient documentation

## 2015-08-23 LAB — URINALYSIS, ROUTINE W REFLEX MICROSCOPIC
BILIRUBIN URINE: NEGATIVE
Glucose, UA: NEGATIVE mg/dL
HGB URINE DIPSTICK: NEGATIVE
KETONES UR: NEGATIVE mg/dL
Nitrite: NEGATIVE
Protein, ur: NEGATIVE mg/dL
Specific Gravity, Urine: 1.028 (ref 1.005–1.030)
pH: 6 (ref 5.0–8.0)

## 2015-08-23 LAB — URINE MICROSCOPIC-ADD ON

## 2015-08-23 MED ORDER — MORPHINE SULFATE (PF) 4 MG/ML IV SOLN
8.0000 mg | Freq: Once | INTRAVENOUS | Status: AC
  Administered 2015-08-23: 8 mg via INTRAVENOUS
  Filled 2015-08-23: qty 2

## 2015-08-23 MED ORDER — LEVOFLOXACIN 500 MG PO TABS: 500.0000 mg | ORAL_TABLET | Freq: Every day | ORAL | Status: DC

## 2015-08-23 MED ORDER — NAPROXEN 500 MG PO TABS: 500.0000 mg | ORAL_TABLET | Freq: Two times a day (BID) | ORAL | Status: DC

## 2015-08-23 MED ORDER — CEFTRIAXONE SODIUM 250 MG IJ SOLR
250.0000 mg | Freq: Once | INTRAMUSCULAR | Status: AC
  Administered 2015-08-23: 250 mg via INTRAMUSCULAR
  Filled 2015-08-23: qty 250

## 2015-08-23 MED ORDER — OXYCODONE-ACETAMINOPHEN 5-325 MG PO TABS: 1.0000 | ORAL_TABLET | ORAL | Status: DC | PRN

## 2015-08-23 MED ORDER — LIDOCAINE HCL (PF) 1 % IJ SOLN
INTRAMUSCULAR | Status: AC
  Administered 2015-08-23: 1 mL
  Filled 2015-08-23: qty 5

## 2015-08-23 NOTE — Discharge Instructions (Signed)
You were given a shot of antibiotic and need to take the antibiotic given for 10 days. Follow up with urology if not better. You were also given IV morphine for pain. Do not have sex/intercourse until you have not have symptoms for 1 week

## 2015-08-23 NOTE — ED Notes (Signed)
Patient brought back to room via wheelchair with family in tow; patient getting undressed and into a gown at this time

## 2015-08-23 NOTE — ED Provider Notes (Signed)
CSN: [id number]     Arrival date & time 08/23/15  0806 History   First MD Initiated Contact with Patient 08/23/15 [phone]     Chief Complaint  Patient presents with  . Groin Pain  . Groin Swelling     (Consider location/radiation/quality/duration/timing/severity/associated sxs/prior Treatment) HPI  25 year old male presents with acute left testicular pain. Patient states that he developed some mild pain yesterday. It felt like he was uncomfortable and thought maybe his underwear was too tight. However last night around 1 AM he developed sudden worsening of the pain. It has been constant and 10/10 since. He feels like his scrotum is swollen. Pain is only on his left testicle but also having some groin pain. Vomited once in the middle the night. Having some nausea as well. About an hour before the pain got significantly worse he had ejaculated and had some blood. Has not been noticing any discharge. No dysuria otherwise. No back pain. Patient has a history of HIV. He states he has not recently been having unprotected sex and is not concerned for an STI.  Past Medical History  Diagnosis Date  . HIV disease (HCC) 2011  . Syphilis 2014  . Anal condyloma   . Diarrhea   . Hemorrhoids    Past Surgical History  Procedure Laterality Date  . Colon surgery     Family History  Problem Relation Age of Onset  . Diabetes Mother   . Diabetes Maternal Grandmother   . Diabetes Maternal Grandfather   . Cancer Maternal Grandmother     breast   Social History  Substance Use Topics  . Smoking status: Former Smoker    Types: Cigarettes  . Smokeless tobacco: Never Used  . Alcohol Use: Yes     Comment: occasionally    Review of Systems  Constitutional: Negative for fever.  Gastrointestinal: Positive for vomiting. Negative for abdominal pain.  Genitourinary: Positive for hematuria, scrotal swelling and testicular pain. Negative for dysuria, discharge, penile swelling and penile pain.   Musculoskeletal: Negative for back pain.  All other systems reviewed and are negative.     Allergies  Risperdal; Shellfish allergy; and Stribild  Home Medications   Prior to Admission medications   Medication Sig Start Date End Date Taking? Authorizing Provider  amoxicillin (AMOXIL) 250 MG capsule Take 3 capsules (750 mg total) by mouth 2 (two) times daily. Patient not taking: Reported on 07/13/2015 04/04/14   Harold Barban, MD  docusate sodium (COLACE) 100 MG capsule Take 1 capsule (100 mg total) by mouth every 12 (twelve) hours. 07/13/15   Roxy Horseman, PA-C  ibuprofen (ADVIL,MOTRIN) 800 MG tablet Take 1 tablet (800 mg total) by mouth 3 (three) times daily. 07/13/15   Roxy Horseman, PA-C  ondansetron (ZOFRAN) 4 MG tablet Take 1 tablet (4 mg total) by mouth every 8 (eight) hours as needed for nausea or vomiting. Patient not taking: Reported on 07/13/2015 04/04/14   Harold Barban, MD  polyethylene glycol powder (GLYCOLAX/MIRALAX) powder Take 17 g by mouth 2 (two) times daily. Until daily soft stools  OTC 07/13/15   Roxy Horseman, PA-C   BP 135/99 mmHg  Pulse 100  Temp(Src) 98 F (36.7 C) (Oral)  Resp 22  SpO2 100% Physical Exam  Constitutional: He is oriented to person, place, and time. He appears well-developed and well-nourished.  HENT:  Head: Normocephalic and atraumatic.  Right Ear: External ear normal.  Left Ear: External ear normal.  Nose: Nose normal.  Eyes: Right eye exhibits no discharge. Left  eye exhibits no discharge.  Neck: Neck supple.  Cardiovascular: Normal rate, regular rhythm, normal heart sounds and intact distal pulses.   Pulmonary/Chest: Effort normal and breath sounds normal.  Abdominal: Soft. He exhibits no distension. There is no tenderness.  Genitourinary: Penis normal. Right testis is descended. Left testis shows tenderness. Left testis is descended. No penile erythema or penile tenderness. No discharge found.  Difficult to get cremasteric reflex on  either side. Left testicle tender and seems to be elevated compared to right. No scrotal erythema  Musculoskeletal: He exhibits no edema.  Neurological: He is alert and oriented to person, place, and time.  Skin: Skin is warm and dry.  Nursing note and vitals reviewed.   ED Course  Procedures (including critical care time) Labs Review Labs Reviewed  URINALYSIS, ROUTINE W REFLEX MICROSCOPIC (NOT AT [facility]) - Abnormal; Notable for the following:    Color, Urine AMBER (*)    Leukocytes, UA TRACE (*)    All other components within normal limits  URINE MICROSCOPIC-ADD ON - Abnormal; Notable for the following:    Squamous Epithelial / LPF 0-5 (*)    Bacteria, UA RARE (*)    All other components within normal limits  GC/CHLAMYDIA PROBE AMP ([place]) NOT AT [facility]    Imaging Review US Scrotum  08/23/2015  CLINICAL DATA:  Left-sided scrotal pain with vomiting Find EXAM: SCROTAL ULTRASOUND DOPPLER ULTRASOUND OF THE TESTICLES TECHNIQUE: Complete ultrasound examination of the testicles, epididymis, and other scrotal structures was performed. Color and spectral Doppler ultrasound were also utilized to evaluate blood flow to the testicles. COMPARISON:  None. FINDINGS: Right testicle Measurements: 4.8 x 2.6 x 2.8 cm. No mass or microlithiasis visualized. Left testicle Measurements: 4.4 x 2.8 x 3.3 cm. No mass or microlithiasis visualized. Left testis is hyperemic and subtly edematous. Right epididymis: The right epididymis is normal in size and contour without inflammation. A 2 mm calcification is noted in the head of the epididymis incidentally. Left epididymis: The left epididymis is markedly edematous with extensive hyperemia. There is no well-defined epididymal mass on the left. Hydrocele: There are bilateral hydroceles, moderate on the left and slightly smaller on the right. A septation is noted in the hydrocele on the left. Varicocele:  None visualized. Pulsed Doppler interrogation of both testes  demonstrates normal low resistance arterial and venous waveforms bilaterally. IMPRESSION: Marked enlargement of the left epididymis with extensive hyperemia of the left epididymis. There is also hyperemia in the left testis with slight left testicular edema. These findings are consistent with epididymo-orchitis with inflammation primarily involving the left epididymis. There are bilateral hydroceles, larger on the left than on the right. Mild septation in the left sided hydrocele is likely due to inflammatory change. Small calcification in the right epididymis may represent residua of previous inflammatory lesion. No demonstrable testicular mass or torsion. Electronically Signed   By: Bretta Bang III M.D.   On: 08/23/2015 10:45   [place] Art/ven Flow Abd Pelv Doppler  08/23/2015  CLINICAL DATA:  Left-sided scrotal pain with vomiting Find EXAM: SCROTAL ULTRASOUND DOPPLER ULTRASOUND OF THE TESTICLES TECHNIQUE: Complete ultrasound examination of the testicles, epididymis, and other scrotal structures was performed. Color and spectral Doppler ultrasound were also utilized to evaluate blood flow to the testicles. COMPARISON:  None. FINDINGS: Right testicle Measurements: 4.8 x 2.6 x 2.8 cm. No mass or microlithiasis visualized. Left testicle Measurements: 4.4 x 2.8 x 3.3 cm. No mass or microlithiasis visualized. Left testis is hyperemic and subtly edematous. Right epididymis:  The right epididymis is normal in size and contour without inflammation. A 2 mm calcification is noted in the head of the epididymis incidentally. Left epididymis: The left epididymis is markedly edematous with extensive hyperemia. There is no well-defined epididymal mass on the left. Hydrocele: There are bilateral hydroceles, moderate on the left and slightly smaller on the right. A septation is noted in the hydrocele on the left. Varicocele:  None visualized. Pulsed Doppler interrogation of both testes demonstrates normal low resistance arterial  and venous waveforms bilaterally. IMPRESSION: Marked enlargement of the left epididymis with extensive hyperemia of the left epididymis. There is also hyperemia in the left testis with slight left testicular edema. These findings are consistent with epididymo-orchitis with inflammation primarily involving the left epididymis. There are bilateral hydroceles, larger on the left than on the right. Mild septation in the left sided hydrocele is likely due to inflammatory change. Small calcification in the right epididymis may represent residua of previous inflammatory lesion. No demonstrable testicular mass or torsion. Electronically Signed   By: Bretta BangWilliam  Woodruff III M.D.   On: 08/23/2015 10:45   I have personally reviewed and evaluated these images and lab results as part of my medical decision-making.   EKG Interpretation None      MDM   Final diagnoses:  Pain in left testicle  Epididymo-orchitis, acute    9:17 AM I am concerned about torsion given acute worsening of pain and abnormal lie. D/w U/s tech. Sonographer is currently in a study and they will come do him next.   Patient's u/s shows epididymo-orchitis. No torsion. Pain is improved. Patient has anal sex with men only, thus will cover enteric organisms in addition to GC/Chlamydia. Given IM rocephin and will d/c with levaquin and pain control. F/u with urology. F/u with PCP or ID. Return if symptoms worsen.   Pricilla LovelessScott Brylee Mcgreal, MD 08/23/15 [phone]

## 2015-08-23 NOTE — ED Notes (Addendum)
Patient here with 1 day of groin swelling/testicles with pain radiating to abdomen with nausea and 1 episode of vomiting. States that he does a lot of lifting at work. Reports that when he ejaculated lat pm there was a reddish discharge noted

## 2015-08-24 LAB — GC/CHLAMYDIA PROBE AMP (~~LOC~~) NOT AT ARMC
Chlamydia: POSITIVE — AB
Neisseria Gonorrhea: NEGATIVE

## 2015-08-27 ENCOUNTER — Telehealth (HOSPITAL_BASED_OUTPATIENT_CLINIC_OR_DEPARTMENT_OTHER): Payer: Self-pay | Admitting: Emergency Medicine

## 2015-08-27 NOTE — Telephone Encounter (Signed)
Chart handoff to EDP for treatment plan for + chlamydia 

## 2015-08-28 ENCOUNTER — Telehealth (HOSPITAL_BASED_OUTPATIENT_CLINIC_OR_DEPARTMENT_OTHER): Payer: Self-pay | Admitting: Emergency Medicine

## 2015-08-28 NOTE — Telephone Encounter (Signed)
Post ED Visit - Positive Culture Follow-up: Successful Patient Follow-Up  Culture assessed and recommendations reviewed by: []  Enzo BiNathan Batchelder, Pharm.D. []  Celedonio MiyamotoJeremy Frens, Pharm.D., BCPS []  Garvin FilaMike Maccia, Pharm.D. []  Georgina PillionElizabeth Martin, Pharm.D., BCPS []  KoliganekMinh Pham, [address]Pharm.D., BCPS, AAHIVP []  Estella HuskMichelle Turner, Pharm.D., BCPS, AAHIVP []  Tennis Mustassie Stewart, Pharm.D. []  Sherle Poeob Vincent, [address]Ph[address]  Positive chlamydia culture  [x]  Patient discharged without antimicrobial prescription and treatment is now indicated []  Organism is resistant to prescribed ED discharge antimicrobial []  Patient with positive blood cultures  Changes discussed with ED provider: DR. Azalia BilisKevin Campos New antibiotic prescription Azithromycin 1 gram po x 1  Attempting to notify patient    Berle MullMiller, Joycelin Radloff 08/28/2015, 12:15 PM

## 2015-08-29 ENCOUNTER — Other Ambulatory Visit: Payer: Self-pay

## 2015-09-07 ENCOUNTER — Other Ambulatory Visit (INDEPENDENT_AMBULATORY_CARE_PROVIDER_SITE_OTHER): Payer: Self-pay

## 2015-09-07 DIAGNOSIS — B20 Human immunodeficiency virus [HIV] disease: Secondary | ICD-10-CM

## 2015-09-07 DIAGNOSIS — Z113 Encounter for screening for infections with a predominantly sexual mode of transmission: Secondary | ICD-10-CM

## 2015-09-07 LAB — T-HELPER CELL (CD4) - (RCID CLINIC ONLY)
CD4 % Helper T Cell: 9 % — ABNORMAL LOW (ref 33–55)
CD4 T CELL ABS: 170 /uL — AB (ref 400–2700)

## 2015-09-08 ENCOUNTER — Emergency Department (HOSPITAL_COMMUNITY)
Admission: EM | Admit: 2015-09-08 | Discharge: 2015-09-09 | Disposition: A | Discharge status: Home / Self Care | Payer: Self-pay | Attending: Emergency Medicine | Admitting: Emergency Medicine

## 2015-09-08 ENCOUNTER — Other Ambulatory Visit: Payer: Self-pay

## 2015-09-08 ENCOUNTER — Encounter (HOSPITAL_COMMUNITY): Payer: Self-pay | Admitting: Emergency Medicine

## 2015-09-08 DIAGNOSIS — L0231 Cutaneous abscess of buttock: Secondary | ICD-10-CM | POA: Insufficient documentation

## 2015-09-08 DIAGNOSIS — R000 Tachycardia, unspecified: Secondary | ICD-10-CM | POA: Insufficient documentation

## 2015-09-08 DIAGNOSIS — Z79899 Other long term (current) drug therapy: Secondary | ICD-10-CM | POA: Insufficient documentation

## 2015-09-08 DIAGNOSIS — Z87891 Personal history of nicotine dependence: Secondary | ICD-10-CM | POA: Insufficient documentation

## 2015-09-08 DIAGNOSIS — Z791 Long term (current) use of non-steroidal anti-inflammatories (NSAID): Secondary | ICD-10-CM | POA: Insufficient documentation

## 2015-09-08 DIAGNOSIS — A630 Anogenital (venereal) warts: Secondary | ICD-10-CM | POA: Insufficient documentation

## 2015-09-08 LAB — COMPREHENSIVE METABOLIC PANEL
ALBUMIN: 3.2 g/dL — AB (ref 3.5–5.0)
ALK PHOS: 63 U/L (ref 38–126)
ALT: 17 U/L (ref 17–63)
AST: 26 U/L (ref 15–41)
Anion gap: 10 (ref 5–15)
BILIRUBIN TOTAL: 0.7 mg/dL (ref 0.3–1.2)
CHLORIDE: 105 mmol/L (ref 101–111)
CO2: 21 mmol/L — AB (ref 22–32)
Calcium: 9 mg/dL (ref 8.9–10.3)
Creatinine, Ser: 0.86 mg/dL (ref 0.61–1.24)
GFR calc Af Amer: >60 mL/min (ref >60)
GFR calc non Af Amer: >60 mL/min (ref >60)
GLUCOSE: 106 mg/dL — AB (ref 65–99)
POTASSIUM: 2.6 mmol/L — AB (ref 3.5–5.1)
Sodium: 136 mmol/L (ref 135–145)
Total Protein: 9 g/dL — ABNORMAL HIGH (ref 6.5–8.1)

## 2015-09-08 LAB — CBC
HEMATOCRIT: 37.5 % — AB (ref 39.0–52.0)
HEMOGLOBIN: 12.4 g/dL — AB (ref 13.0–17.0)
MCH: 30.5 pg (ref 26.0–34.0)
MCHC: 33.1 g/dL (ref 30.0–36.0)
MCV: 92.1 fL (ref 78.0–100.0)
Platelets: 279 10*3/uL (ref 150–400)
RBC: 4.07 MIL/uL — ABNORMAL LOW (ref 4.22–5.81)
RDW: 12.7 % (ref 11.5–15.5)
WBC: 9.2 10*3/uL (ref 4.0–10.5)

## 2015-09-08 LAB — RPR: RPR Ser Ql: REACTIVE — AB

## 2015-09-08 LAB — TYPE AND SCREEN
ABO/RH(D): O POS
ANTIBODY SCREEN: NEGATIVE

## 2015-09-08 LAB — I-STAT TROPONIN, ED: Troponin i, poc: 0 ng/mL (ref 0.00–0.08)

## 2015-09-08 LAB — RPR TITER: RPR Titer: 1:8 {titer}

## 2015-09-08 MED ORDER — FENTANYL CITRATE (PF) 100 MCG/2ML IJ SOLN
INTRAMUSCULAR | Status: AC
  Filled 2015-09-08: qty 2

## 2015-09-08 MED ORDER — POTASSIUM CHLORIDE CRYS ER 20 MEQ PO TBCR
80.0000 meq | EXTENDED_RELEASE_TABLET | Freq: Once | ORAL | Status: AC
  Administered 2015-09-09: 80 meq via ORAL
  Filled 2015-09-08: qty 4

## 2015-09-08 MED ORDER — MAGNESIUM OXIDE 400 (241.3 MG) MG PO TABS
800.0000 mg | ORAL_TABLET | Freq: Once | ORAL | Status: AC
  Administered 2015-09-09: 800 mg via ORAL
  Filled 2015-09-08: qty 2

## 2015-09-08 MED ORDER — FENTANYL CITRATE (PF) 100 MCG/2ML IJ SOLN
50.0000 ug | INTRAMUSCULAR | Status: DC | PRN
  Administered 2015-09-08: 50 ug via INTRAVENOUS

## 2015-09-08 NOTE — ED Provider Notes (Addendum)
CSN: [id number]     Arrival date & time 09/08/15  2128 History  By signing my name below, I, Rosario Adie, attest that this documentation has been prepared under the direction and in the presence of Derwood Kaplan, MD.  Electronically Signed: Rosario Adie, ED Scribe. 09/09/2015. 12:09 AM.  Chief Complaint  Patient presents with  . Rectal Bleeding  . Chest Pain  . Tachycardia   The history is provided by the patient. No language interpreter was used.   HPI Comments: Jay Simon is a 25 y.o. male with a PMHx of HIV infection, HPV (dx'd 2013), and anal condyloma who presents to the Emergency Department complaining of gradual onset, gradually worsening, intermittent bright red rectal bleeding and pain onset 5 days ago. He has associated chest pain, SOB, lightheadedness, night sweats, loss of appetite, and dizziness. He states that he noticed a green mucus with the bleeding that presented 3 days ago. He denies a hx of similar sx. Pt reports that he has tried using topical Preparation H with no relief of his pain. He reports that his pain is aggravated upon sitting. He denies any known trauma to the area. Pt has a PSHx of colon polyp removal. Pt does not know his CD4 count for his active HIV infection, but he recently had his blood drawn and will know it soon. He is followed by by Dr. Effie Shy (Infectious Disease Specialist) for the infection.  Past Medical History  Diagnosis Date  . HIV disease (HCC) 2011  . Syphilis 2014  . Anal condyloma   . Diarrhea   . Hemorrhoids    Past Surgical History  Procedure Laterality Date  . Colon surgery     Family History  Problem Relation Age of Onset  . Diabetes Mother   . Diabetes Maternal Grandmother   . Diabetes Maternal Grandfather   . Cancer Maternal Grandmother     breast   Social History  Substance Use Topics  . Smoking status: Former Smoker    Types: Cigarettes  . Smokeless tobacco: Never Used  . Alcohol Use: Yes      Comment: occasionally    Review of Systems  Gastrointestinal: Positive for hematochezia.  10 Systems reviewed and all are negative for acute change except as noted in the HPI.  Allergies  Risperdal and Shellfish allergy  Home Medications   Prior to Admission medications   Medication Sig Start Date End Date Taking? Authorizing Provider  amoxicillin (AMOXIL) 250 MG capsule Take 3 capsules (750 mg total) by mouth 2 (two) times daily. Patient not taking: Reported on 07/13/2015 04/04/14   Harold Barban, MD  docusate sodium (COLACE) 100 MG capsule Take 1 capsule (100 mg total) by mouth every 12 (twelve) hours. Patient not taking: Reported on 09/09/2015 07/13/15   Roxy Horseman, PA-C  ibuprofen (ADVIL,MOTRIN) 800 MG tablet Take 1 tablet (800 mg total) by mouth 3 (three) times daily. Patient not taking: Reported on 09/09/2015 07/13/15   Roxy Horseman, PA-C  levofloxacin (LEVAQUIN) 500 MG tablet Take 1 tablet (500 mg total) by mouth daily. Patient not taking: Reported on 09/09/2015 08/23/15   Pricilla Loveless, MD  naproxen (NAPROSYN) 500 MG tablet Take 1 tablet (500 mg total) by mouth 2 (two) times daily. Patient not taking: Reported on 09/09/2015 08/23/15   Pricilla Loveless, MD  ondansetron (ZOFRAN) 4 MG tablet Take 1 tablet (4 mg total) by mouth every 8 (eight) hours as needed for nausea or vomiting. Patient not taking: Reported on 07/13/2015 04/04/14  Harold BarbanLawrence Ngo, MD  oxyCODONE-acetaminophen (PERCOCET/ROXICET) 5-325 MG tablet Take 1 tablet by mouth every 4 (four) hours as needed for severe pain. Patient not taking: Reported on 09/09/2015 08/23/15   Pricilla LovelessScott Goldston, MD  polyethylene glycol powder (GLYCOLAX/MIRALAX) powder Take 17 g by mouth 2 (two) times daily. Until daily soft stools  OTC Patient not taking: Reported on 09/09/2015 07/13/15   Roxy Horsemanobert Browning, PA-C  sulfamethoxazole-trimethoprim (BACTRIM DS,SEPTRA DS) 800-160 MG tablet Take 1 tablet by mouth 2 (two) times daily. 09/09/15 09/16/15  Derwood KaplanAnkit Jamarl Pew,  MD  traMADol (ULTRAM) 50 MG tablet Take 1 tablet (50 mg total) by mouth every 6 (six) hours as needed. 09/09/15   Lynnett Langlinais Rhunette CroftNanavati, MD   BP 129/84 mmHg  Pulse 121  Temp(Src) 98.1 F (36.7 C) (Oral)  Resp 20  Ht 5\' 8"  (1.727 m)  Wt 150 lb (68.04 kg)  BMI 22.81 kg/m2  SpO2 98%   Physical Exam  Cardiovascular: Tachycardia present.   Pulmonary/Chest: Effort normal and breath sounds normal. No respiratory distress. He has no wheezes. He has no rhonchi. He has no rales.  Abdominal: Soft. He exhibits no distension. There is no tenderness. There is no rebound and no guarding.  Genitourinary:  L gluteal fold: There is a 9cm fluctuant lesion. There are some warts around the anal region. No active drainage was noted on exam.   Nursing note and vitals reviewed.   ED Course  Procedures (including critical care time)  INCISION AND DRAINAGE Performed by: Derwood KaplanNanavati, Demitrious Mccannon Consent: Verbal consent obtained. Risks and benefits: risks, benefits and alternatives were discussed Type: abscess  Body area: L gluteal fold  Anesthesia: local infiltration  Incision was made with a scalpel.  Local anesthetic: lidocaine 2 % with epinephrine  Anesthetic total: 5 ml  Complexity: complex Blunt dissection to break up loculations  Drainage: purulent  Drainage amount: copious  Packing material: 1/2 in iodoform gauze  Patient tolerance: Patient tolerated the procedure well with no immediate complications.     DIAGNOSTIC STUDIES: Oxygen Saturation is 100% on RA, normal by my interpretation.   COORDINATION OF CARE: 12:07 AM-Discussed next steps with pt including CMP, CBC, rectal exam, and EKG. Pt verbalized understanding and is agreeable with the plan.   Labs Review Labs Reviewed  COMPREHENSIVE METABOLIC PANEL - Abnormal; Notable for the following:    Potassium 2.6 (*)    CO2 21 (*)    Glucose, Bld 106 (*)    BUN <5 (*)    Total Protein 9.0 (*)    Albumin 3.2 (*)    All other components  within normal limits  CBC - Abnormal; Notable for the following:    RBC 4.07 (*)    Hemoglobin 12.4 (*)    HCT 37.5 (*)    All other components within normal limits  AEROBIC CULTURE (SUPERFICIAL SPECIMEN)  MAGNESIUM  I-STAT TROPOININ, ED  TYPE AND SCREEN  ABO/RH    Imaging Review No results found. I have personally reviewed and evaluated these images and lab results as part of my medical decision-making.   EKG Interpretation None      MDM   Final diagnoses:  Gluteal abscess    I personally performed the services described in this documentation, which was scribed in my presence. The recorded information has been reviewed and is accurate.  Pt comes in with rectal pain and bleeding. He appears to have an abscess, in the gluteal fold around the rectum. Pt is immunocompromised and reporting subjective fevers, chills, sweats - but outside  of tachycardia, vitals are reassuring right now.   Derwood Kaplan, MD 09/09/15 0057  3:07 AM Pt had copious purulent d/c. There is no signs of necrotizing infection - as pt has no generalized tenderness. Also the labs are reassuring. CT has been cancelled. Repeat eval in 48 hours recommended. Will start bactrim. ABSCESS WAS CULTURED. Strict ER return precautions have been discussed, and patient is agreeing with the plan and is comfortable with the workup done and the recommendations from the ER.   Derwood Kaplan, MD 09/09/15 [phone]

## 2015-09-08 NOTE — ED Notes (Signed)
Patient arrives with complaint of rectal bleeding for 2 days. States that today he began to feel chest pain. Tachycardic in triage with HR at 150. Also endorses SOB, lighteadedness, and dizziness.

## 2015-09-09 LAB — ABO/RH: ABO/RH(D): O POS

## 2015-09-09 LAB — MAGNESIUM: Magnesium: 1.9 mg/dL (ref 1.7–2.4)

## 2015-09-09 MED ORDER — HYDROMORPHONE HCL 1 MG/ML IJ SOLN
1.0000 mg | Freq: Once | INTRAMUSCULAR | Status: AC
Start: 2015-09-09 — End: 2015-09-09
  Administered 2015-09-09: 1 mg via INTRAVENOUS
  Filled 2015-09-09: qty 1

## 2015-09-09 MED ORDER — IOPAMIDOL (ISOVUE-300) INJECTION 61%
INTRAVENOUS | Status: AC
  Filled 2015-09-09: qty 100

## 2015-09-09 MED ORDER — SULFAMETHOXAZOLE-TRIMETHOPRIM 800-160 MG PO TABS: 1.0000 | ORAL_TABLET | Freq: Two times a day (BID) | ORAL | Status: AC

## 2015-09-09 MED ORDER — SULFAMETHOXAZOLE-TRIMETHOPRIM 800-160 MG PO TABS
1.0000 | ORAL_TABLET | Freq: Once | ORAL | Status: AC
  Administered 2015-09-09: 1 via ORAL
  Filled 2015-09-09: qty 1

## 2015-09-09 MED ORDER — LIDOCAINE-EPINEPHRINE (PF) 2 %-1:200000 IJ SOLN
10.0000 mL | Freq: Once | INTRAMUSCULAR | Status: AC
  Administered 2015-09-09: 10 mL
  Filled 2015-09-09: qty 20

## 2015-09-09 MED ORDER — POTASSIUM CHLORIDE CRYS ER 20 MEQ PO TBCR
40.0000 meq | EXTENDED_RELEASE_TABLET | Freq: Once | ORAL | Status: AC
  Administered 2015-09-09: 40 meq via ORAL
  Filled 2015-09-09: qty 2

## 2015-09-09 MED ORDER — HYDROCODONE-ACETAMINOPHEN 5-325 MG PO TABS
1.0000 | ORAL_TABLET | Freq: Once | ORAL | Status: AC
  Administered 2015-09-09: 1 via ORAL
  Filled 2015-09-09: qty 1

## 2015-09-09 MED ORDER — TRAMADOL HCL 50 MG PO TABS: 50.0000 mg | ORAL_TABLET | Freq: Four times a day (QID) | ORAL | Status: DC | PRN

## 2015-09-09 NOTE — ED Notes (Signed)
Pt refused the pot pills

## 2015-09-09 NOTE — ED Notes (Signed)
Lidocaine coming from pharmacy

## 2015-09-09 NOTE — Discharge Instructions (Signed)
We saw you in the ER for the abscess. We drained the abscess, and have packed the wound. Keep the packing in there for 2 days, and see a primary care doctor or return to the ER for the packing removal and wound recheck.  Keep the wound clean and dry otherwise, and allow for any drainage to occur.  Please return to the ER if your symptoms worsen; you have increased pain, fevers, chills, inability to keep any medications down, confusion. Otherwise see the outpatient doctor as requested.   Incision and Drainage Incision and drainage is a procedure in which a sac-like structure (cystic structure) is opened and drained. The area to be drained usually contains material such as pus, fluid, or blood.  LET YOUR CAREGIVER KNOW ABOUT:   Allergies to medicine.  Medicines taken, including vitamins, herbs, eyedrops, over-the-counter medicines, and creams.  Use of steroids (by mouth or creams).  Previous problems with anesthetics or numbing medicines.  History of bleeding problems or blood clots.  Previous surgery.  Other health problems, including diabetes and kidney problems.  Possibility of pregnancy, if this applies. RISKS AND COMPLICATIONS  Pain.  Bleeding.  Scarring.  Infection. BEFORE THE PROCEDURE  You may need to have an ultrasound or other imaging tests to see how large or deep your cystic structure is. Blood tests may also be used to determine if you have an infection or how severe the infection is. You may need to have a tetanus shot. PROCEDURE  The affected area is cleaned with a cleaning fluid. The cyst area will then be numbed with a medicine (local anesthetic). A small incision will be made in the cystic structure. A syringe or catheter may be used to drain the contents of the cystic structure, or the contents may be squeezed out. The area will then be flushed with a cleansing solution. After cleansing the area, it is often gently packed with a gauze or another wound  dressing. Once it is packed, it will be covered with gauze and tape or some other type of wound dressing. AFTER THE PROCEDURE   Often, you will be allowed to go home right after the procedure.  You may be given antibiotic medicine to prevent or heal an infection.  If the area was packed with gauze or some other wound dressing, you will likely need to come back in 1 to 2 days to get it removed.  The area should heal in about 14 days.   This information is not intended to replace advice given to you by your health care provider. Make sure you discuss any questions you have with your health care provider.   Document Released: 09/03/2000 Document Revised: 09/09/2011 Document Reviewed: 05/05/2011 Elsevier Interactive Patient Education [employer]2016 Elsevier Inc.

## 2015-09-09 NOTE — ED Notes (Signed)
This RN and MD were present for incision and drainage of rectal abscess.  Patient tolerated well.  No active bleeding noted, dressing secure.  Will continue to monitor

## 2015-09-10 LAB — HIV-1 RNA ULTRAQUANT REFLEX TO GENTYP+
HIV 1 RNA QUANT: 52813 {copies}/mL — AB (ref <20)
HIV-1 RNA Quant, Log: 4.72 Log copies/mL — ABNORMAL HIGH (ref <1.30)

## 2015-09-10 LAB — FLUORESCENT TREPONEMAL AB(FTA)-IGG-BLD: Fluorescent Treponemal ABS: REACTIVE — AB

## 2015-09-11 ENCOUNTER — Ambulatory Visit: Payer: Self-pay | Admitting: Internal Medicine

## 2015-09-11 ENCOUNTER — Telehealth: Payer: Self-pay | Admitting: *Deleted

## 2015-09-11 LAB — AEROBIC CULTURE  (SUPERFICIAL SPECIMEN)

## 2015-09-11 NOTE — Telephone Encounter (Signed)
Patient No Showed for appt with Dr Luciana Axeomer, 09/11/15.  Viral load is detectable and CD4 is 170.  Needs return appointment with [facility].  He does not necessarily need to see Dr. Luciana Axeomer.  Last seen by Dr. Drue SecondSnider, 2015.  RN requested the case manager call [facility] to discuss "next steps."

## 2015-09-12 ENCOUNTER — Telehealth: Payer: Self-pay | Admitting: *Deleted

## 2015-09-12 NOTE — Progress Notes (Signed)
ED Antimicrobial Stewardship Positive Culture Follow Up   Lynnae SandhoffChristian Dralle is an 25 y.o. male who presented to [facility] on 09/08/2015 with a chief complaint of  Chief Complaint  Patient presents with  . Rectal Bleeding  . Chest Pain  . Tachycardia    Recent Results (from the past 720 hour(s))  Wound or Superficial Culture     Status: None   Collection Time: 09/09/15  3:41 AM  Result Value Ref Range Status   Specimen Description WOUND  Final   Special Requests LOWER BACK  Final   Gram Stain   Final    MODERATE WBC PRESENT,BOTH PMN AND MONONUCLEAR MODERATE GRAM POSITIVE COCCI IN PAIRS AND CHAINS FEW GRAM NEGATIVE RODS    Culture MODERATE STREPTOCOCCUS GROUP C  Final   Report Status 09/11/2015 FINAL  Final     [x]  Treated with Bactrim, organism resistant to prescribed antimicrobial  New antibiotic prescription: Stop Bactrim. Amoxicillin 500 mg PO BID x 7 days.  ED Provider: Jaynie Crumbleatyana Kirichenko, PA-C  Hisham Provence L. Roseanne RenoStewart, PharmD PGY2 Infectious Diseases Pharmacy Resident Pager: [phone] 09/12/2015 10:02 AM

## 2015-09-12 NOTE — ED Notes (Signed)
Post ED Visit - Positive Culture Follow-up: Unsuccessful Patient Follow-up  Culture assessed and recommendations reviewed by: []  Enzo BiNathan Batchelder, Pharm.D. []  Celedonio MiyamotoJeremy Frens, Pharm.D., BCPS []  Garvin FilaMike Maccia, Pharm.D. []  Georgina PillionElizabeth Martin, Pharm.D., BCPS []  RoseMinh Pham, [address]Pharm.D., BCPS, AAHIVP []  Estella HuskMichelle Turner, Pharm.D., BCPS, AAHIVP [x]  Tennis Mustassie Stewart, Pharm.D. []  Sherle Poeob Vincent, [address]Pharm[address]D.  Positive wound culture  []  Patient discharged without antimicrobial prescription and treatment is now indicated [x]  Organism is resistant to prescribed ED discharge antimicrobial []  Patient with positive blood cultures   Unable to contact patient after 3 attempts, letter will be sent to address on file  Lysle PearlRobertson, Wei Newbrough Talley 09/12/2015, 10:37 AM

## 2015-09-21 LAB — HIV-1 GENOTYPR PLUS

## 2015-10-03 ENCOUNTER — Encounter: Payer: Self-pay | Admitting: Internal Medicine

## 2015-11-06 ENCOUNTER — Ambulatory Visit (INDEPENDENT_AMBULATORY_CARE_PROVIDER_SITE_OTHER): Payer: Self-pay | Admitting: Internal Medicine

## 2015-11-06 ENCOUNTER — Encounter: Payer: Self-pay | Admitting: Obstetrics and Gynecology

## 2015-11-06 ENCOUNTER — Ambulatory Visit: Payer: Self-pay | Admitting: *Deleted

## 2015-11-06 ENCOUNTER — Encounter: Payer: Self-pay | Admitting: Internal Medicine

## 2015-11-06 VITALS — BP 119/81 | Temp 98.7°F | Ht 69.0 in | Wt 146.0 lb

## 2015-11-06 DIAGNOSIS — F1910 Other psychoactive substance abuse, uncomplicated: Secondary | ICD-10-CM | POA: Insufficient documentation

## 2015-11-06 DIAGNOSIS — K649 Unspecified hemorrhoids: Secondary | ICD-10-CM | POA: Insufficient documentation

## 2015-11-06 DIAGNOSIS — B20 Human immunodeficiency virus [HIV] disease: Secondary | ICD-10-CM

## 2015-11-06 DIAGNOSIS — Z21 Asymptomatic human immunodeficiency virus [HIV] infection status: Secondary | ICD-10-CM

## 2015-11-06 DIAGNOSIS — B182 Chronic viral hepatitis C: Secondary | ICD-10-CM

## 2015-11-06 DIAGNOSIS — A09 Infectious gastroenteritis and colitis, unspecified: Secondary | ICD-10-CM

## 2015-11-06 DIAGNOSIS — K640 First degree hemorrhoids: Secondary | ICD-10-CM

## 2015-11-06 MED ORDER — METRONIDAZOLE 500 MG PO TABS: 500.0000 mg | ORAL_TABLET | Freq: Three times a day (TID) | ORAL | 0 refills | Status: DC

## 2015-11-06 MED ORDER — ELVITEG-COBIC-EMTRICIT-TENOFAF 150-150-200-10 MG PO TABS: 1.0000 | ORAL_TABLET | Freq: Every day | ORAL | 5 refills | Status: AC

## 2015-11-06 MED ORDER — SULFAMETHOXAZOLE-TRIMETHOPRIM 800-160 MG PO TABS: 1.0000 | ORAL_TABLET | Freq: Every day | ORAL | 5 refills | Status: AC

## 2015-11-06 MED ORDER — HYDROCORTISONE ACETATE 25 MG RE SUPP: 25.0000 mg | Freq: Two times a day (BID) | RECTAL | 3 refills | Status: DC

## 2015-11-06 NOTE — Progress Notes (Signed)
CC: Follow up for HIV  Interval history: he returns here after a 2 year absence.  He has a history of HIV and was on Atripla, then Stribild but has been off all medications about 2 years.  He also has a history of substance abuse and is in [facility] now and has been drug free about 18 months.  The psychiatrist there has started him on Remeron and he is doing well with that.  Kasandra KnudsenLatuda was stopped due to interactions with ARVs.   He has multiple other complaints including multiple oral ulcers that are painful.  He also has had loose stools and painful bowel movements for several months including anal leakage.  He has noted BRBPR.  No weight loss.  No thrush.  No penile lesions.    Prior to Admission medications   Medication Sig Start Date End Date Taking? Authorizing Provider  mirtazapine (REMERON) 30 MG tablet Take 30 mg by mouth at bedtime.   Yes Historical Provider, MD  elvitegravir-cobicistat-emtricitabine-tenofovir (GENVOYA) 150-150-200-10 MG TABS tablet Take 1 tablet by mouth daily. 11/06/15   Gardiner Barefootobert W Ubaldo Daywalt, MD  hydrocortisone (ANUSOL-HC) 25 MG suppository Place 1 suppository (25 mg total) rectally 2 (two) times daily. 11/06/15   Gardiner Barefootobert W Kayveon Lennartz, MD  metroNIDAZOLE (FLAGYL) 500 MG tablet Take 1 tablet (500 mg total) by mouth 3 (three) times daily. 11/06/15   Gardiner Barefootobert W Davis Ambrosini, MD  sulfamethoxazole-trimethoprim (BACTRIM DS,SEPTRA DS) 800-160 MG tablet Take 1 tablet by mouth daily. 11/06/15   Gardiner Barefootobert W Taisei Bonnette, MD    Review of Systems Constitutional: positive for anorexia due to oral pain, negative for weight loss Respiratory: negative for cough or dyspnea on exertion Gastrointestinal: positive for diarrhea, abdominal pain described as crampy and tenesmus, negative for nausea and vomiting Musculoskeletal: negative for myalgias and arthralgias Behavioral/Psych: positive for sleep difficulty, negative for anxiety All other systems reviewed and are negative   Physical Exam: CONSTITUTIONAL:in no apparent distress  and alert  Vitals:   11/06/15 1118  BP: 119/81  Temp: 98.7 F (37.1 C)   Eyes: anicteric HENT: no thrush, no cervical lymphadenopathy; one oral ulcer noted on back left cheek Respiratory: Normal respiratory effort; CTA B GI: soft, nt, nd GU: perirectal hemorroid, HPV   Lab Results  Component Value Date   HIV1RNAQUANT 52,813 (H) 09/07/2015   HIV1RNAQUANT 19,011 (H) 05/17/2015   HIV1RNAQUANT 47,485 (H) 04/03/2014   No components found for: HIV1GENOTYPRPLUS No components found for: THELPERCELL  Social History   Social History  . Marital status: Single    Spouse name: N/A  . Number of children: N/A  . Years of education: N/A   Occupational History  . Not on file.   Social History Main Topics  . Smoking status: Former Smoker    Types: Cigarettes  . Smokeless tobacco: Never Used  . Alcohol use Yes     Comment: occasionally  . Drug use:     Types: Methamphetamines     Comment: last use 09/2014  . Sexual activity: Yes   Other Topics Concern  . Not on file   Social History Narrative  . No narrative on file

## 2015-11-06 NOTE — BH Specialist Note (Signed)
Counselor met with Jay Simon today in the exam room for a brief encounter due to some reported depression like symptoms.  Patient was oriented times four with sad affect but well dressed.  Patient was alert and talkative.  Patient spoke softly but did communicate that he was not as happy as he felt he should be.  Patient said that after the past three years of having HIV, he still does not feel like he is any closer to truly acceppting it.  Counselor provided support and encouragement for patient as he shared.  Counselor recommended that patient meet to sort out being diagnosed with HIV and processing how to move forward.  Patient did not indicate either way as to whether of nor he would make an appointment.  Counselor provided patient a business card with counselor contact information on it.  Counselor also invited patient to the [facility] HIV support group.  Patient said he would consider coming.  Jay Infante, MA, LPC [facility]

## 2015-11-06 NOTE — Assessment & Plan Note (Signed)
It sounds like he may be having rectal prolapse.  I will try anusol suppository to see if he has some relief from the discomfort, check the pathogen panel to see if there is anything treatable and consider trying to get him to GI or surgery if it persists.

## 2015-11-06 NOTE — Assessment & Plan Note (Signed)
Will consider treatment for this once he is stable on Genvoya.

## 2015-11-06 NOTE — Assessment & Plan Note (Signed)
Will start Genvoya and hope for no resistance.  RTC 3 weeks and will do labs then. Will start bactrim for OI prophylaxis

## 2015-11-06 NOTE — Assessment & Plan Note (Signed)
Not sure if this is related or infectious but I will try flagyl to see if he gets some relief.  Nifurtamox not on [facility] formulary.  Will also check a GI pathogen panel.

## 2015-11-06 NOTE — Assessment & Plan Note (Signed)
Doing well now and followed by [facility].

## 2015-11-19 ENCOUNTER — Other Ambulatory Visit: Payer: Self-pay | Admitting: Internal Medicine

## 2015-11-19 ENCOUNTER — Telehealth: Payer: Self-pay | Admitting: Internal Medicine

## 2015-11-20 NOTE — Telephone Encounter (Signed)
Patient requesting refill of flagyl - prescribed one time without refills for diarrhea. Patient to follow up 9/6.  Please advise if this is appropriate to refill. Andree CossHowell, Shelena Castelluccio M, RN

## 2015-11-20 NOTE — Telephone Encounter (Signed)
No, he does not need more flagyl.  If he is still having diarrhea, it is likely a result of uncontrolled HIV then and not infection. thanks

## 2015-11-26 ENCOUNTER — Telehealth (HOSPITAL_BASED_OUTPATIENT_CLINIC_OR_DEPARTMENT_OTHER): Payer: Self-pay | Admitting: *Deleted

## 2015-11-26 NOTE — Telephone Encounter (Signed)
Post ED Visit - Positive Culture Follow-up: Unsuccessful Patient Follow-up  Culture assessed and recommendations reviewed by: []  Enzo BiNathan Batchelder, Pharm.D. []  Celedonio MiyamotoJeremy Frens, Pharm.D., BCPS []  Garvin FilaMike Maccia, Pharm.D. []  Georgina PillionElizabeth Martin, Pharm.D., BCPS []  SchlusserMinh Pham, [address]Pharm.D., BCPS, AAHIVP []  Estella HuskMichelle Turner, Pharm.D., BCPS, AAHIVP []  Tennis Mustassie Stewart, [address]Pharm[address]D. []  Sherle Poeob Vincent, [place]arm[place]D. Tatyana Kirichenko, PA-C  Positive wound culture  []  Patient discharged without antimicrobial prescription and treatment is now indicated [x]  Organism is resistant to prescribed ED discharge antimicrobial []  Patient with positive blood cultures   Unable to contact patient after 3 attempts, letter sent to address on file with no response.  No further treatment received.  Virl AxeRobertson, Christepher Melchior [place] [facility] 11/26/2015, 10:11 AM

## 2015-11-26 NOTE — Telephone Encounter (Signed)
Post ED Visit - Positive Culture Follow-up: Unsuccessful Patient Follow-up  Culture assessed and recommendations reviewed by: []  Enzo BiNathan Batchelder, Pharm.D. []  Celedonio MiyamotoJeremy Frens, Pharm.D., BCPS []  Garvin FilaMike Maccia, Pharm.D. []  Georgina PillionElizabeth Martin, Pharm.D., BCPS []  Morning GloryMinh Pham, [address]Pharm.D., BCPS, AAHIVP []  Estella HuskMichelle Turner, Pharm.D., BCPS, AAHIVP []  Tennis Mustassie Stewart, Pharm.D. []  Sherle Poeob Vincent, [place]arm.D.  Positive chlamydia culture  [x]  Patient discharged without antimicrobial prescription and treatment is now indicated []  Organism is resistant to prescribed ED discharge antimicrobial []  Patient with positive blood cultures   Unable to contact patient after 3 attempts, letter sent to address on file with no response. No further treatment received.  Virl AxeRobertson, Delsy Etzkorn Talley 11/26/2015, 10:14 AM

## 2015-11-28 ENCOUNTER — Other Ambulatory Visit: Payer: Self-pay | Admitting: Internal Medicine

## 2015-11-28 ENCOUNTER — Ambulatory Visit (INDEPENDENT_AMBULATORY_CARE_PROVIDER_SITE_OTHER): Payer: Self-pay | Admitting: Internal Medicine

## 2015-11-28 ENCOUNTER — Encounter: Payer: Self-pay | Admitting: Internal Medicine

## 2015-11-28 VITALS — BP 106/73 | HR 87 | Temp 98.1°F | Ht 69.0 in | Wt 141.0 lb

## 2015-11-28 DIAGNOSIS — R197 Diarrhea, unspecified: Secondary | ICD-10-CM

## 2015-11-28 DIAGNOSIS — F1910 Other psychoactive substance abuse, uncomplicated: Secondary | ICD-10-CM

## 2015-11-28 DIAGNOSIS — B20 Human immunodeficiency virus [HIV] disease: Secondary | ICD-10-CM

## 2015-11-28 DIAGNOSIS — M791 Myalgia, unspecified site: Secondary | ICD-10-CM

## 2015-11-28 DIAGNOSIS — Z113 Encounter for screening for infections with a predominantly sexual mode of transmission: Secondary | ICD-10-CM

## 2015-11-28 DIAGNOSIS — K640 First degree hemorrhoids: Secondary | ICD-10-CM

## 2015-11-28 DIAGNOSIS — A09 Infectious gastroenteritis and colitis, unspecified: Secondary | ICD-10-CM

## 2015-11-28 DIAGNOSIS — Z21 Asymptomatic human immunodeficiency virus [HIV] infection status: Secondary | ICD-10-CM

## 2015-11-28 LAB — CBC WITH DIFFERENTIAL/PLATELET
BASOS ABS: 0 {cells}/uL (ref 0–200)
Basophils Relative: 0 %
EOS PCT: 9 %
Eosinophils Absolute: 405 cells/uL (ref 15–500)
HCT: 35.7 % — ABNORMAL LOW (ref 38.5–50.0)
Hemoglobin: 11.8 g/dL — ABNORMAL LOW (ref 13.2–17.1)
Lymphocytes Relative: 53 %
Lymphs Abs: 2385 cells/uL (ref 850–3900)
MCH: 30.5 pg (ref 27.0–33.0)
MCHC: 33.1 g/dL (ref 32.0–36.0)
MCV: 92.2 fL (ref 80.0–100.0)
MONOS PCT: 12 %
MPV: 10.7 fL (ref 7.5–12.5)
Monocytes Absolute: 540 cells/uL (ref 200–950)
NEUTROS ABS: 1170 {cells}/uL — AB (ref 1500–7800)
Neutrophils Relative %: 26 %
PLATELETS: 212 10*3/uL (ref 140–400)
RBC: 3.87 MIL/uL — ABNORMAL LOW (ref 4.20–5.80)
RDW: 13.9 % (ref 11.0–15.0)
WBC: 4.5 10*3/uL (ref 3.8–10.8)

## 2015-11-29 DIAGNOSIS — M791 Myalgia, unspecified site: Secondary | ICD-10-CM | POA: Insufficient documentation

## 2015-11-29 LAB — RPR: RPR: REACTIVE — AB

## 2015-11-29 LAB — COMPLETE METABOLIC PANEL WITH GFR
ALBUMIN: 3.3 g/dL — AB (ref 3.6–5.1)
ALK PHOS: 53 U/L (ref 40–115)
ALT: 19 U/L (ref 9–46)
AST: 32 U/L (ref 10–40)
BILIRUBIN TOTAL: 0.3 mg/dL (ref 0.2–1.2)
BUN: 11 mg/dL (ref 7–25)
CALCIUM: 8.8 mg/dL (ref 8.6–10.3)
CO2: 25 mmol/L (ref 20–31)
Chloride: 102 mmol/L (ref 98–110)
Creat: 0.81 mg/dL (ref 0.60–1.35)
GLUCOSE: 86 mg/dL (ref 65–99)
Potassium: 4.2 mmol/L (ref 3.5–5.3)
Sodium: 136 mmol/L (ref 135–146)
TOTAL PROTEIN: 8.4 g/dL — AB (ref 6.1–8.1)

## 2015-11-29 LAB — HIV-1 RNA QUANT-NO REFLEX-BLD
HIV 1 RNA Quant: 68468 copies/mL — ABNORMAL HIGH (ref <20)
HIV-1 RNA Quant, Log: 4.84 Log copies/mL — ABNORMAL HIGH (ref <1.30)

## 2015-11-29 LAB — CK: CK TOTAL: 413 U/L — AB (ref 7–232)

## 2015-11-29 LAB — FLUORESCENT TREPONEMAL AB(FTA)-IGG-BLD: FLUORESCENT TREPONEMAL ABS: REACTIVE — AB

## 2015-11-29 LAB — T-HELPER CELL (CD4) - (RCID CLINIC ONLY)
CD4 % Helper T Cell: 12 % — ABNORMAL LOW (ref 33–55)
CD4 T CELL ABS: 300 /uL — AB (ref 400–2700)

## 2015-11-29 LAB — RPR TITER

## 2015-11-29 NOTE — Assessment & Plan Note (Signed)
Symptoms potentially to GC/chlamydia.  Will send swab today.

## 2015-11-29 NOTE — Assessment & Plan Note (Signed)
Doing well remaining drug free.

## 2015-11-29 NOTE — Assessment & Plan Note (Signed)
Improved with suppository

## 2015-11-29 NOTE — Assessment & Plan Note (Signed)
I suspect this is related to previously uncontrolled HIV.  CK level done and is 413.  He was advised to call if his myalgias worsen.

## 2015-11-29 NOTE — Progress Notes (Signed)
CC: Follow up for HIV  Interval history: he returns for follow up for a second visit after a 2 year absence.  He has a history of HIV and was on Atripla, then Stribild but has been off all medications about 2 years.  He also has a history of substance abuse and is in [facility] now and has been drug free about 18 months.  The psychiatrist there has started him on Remeron and he is doing well with that.  Kasandra KnudsenLatuda was stopped due to interactions with ARVs.   He had multiple other complaints last visit including multiple oral ulcers that are painful, loose stools and painful bowel movements for several months including anal leakage.  He has noted BRBPR.  No weight loss.  No thrush.  No penile lesions.   I started him back on ARVs, gave him empiric Flagyl and hydrocortisone suppository.  His oral lesions have resolved and his diarrhea has improved.  Feels better.  Now though has some itching and muscle aches and is concerned it is related to the medication.    Prior to Admission medications   Medication Sig Start Date End Date Taking? Authorizing Provider  mirtazapine (REMERON) 30 MG tablet Take 30 mg by mouth at bedtime.   Yes Historical Provider, MD  elvitegravir-cobicistat-emtricitabine-tenofovir (GENVOYA) 150-150-200-10 MG TABS tablet Take 1 tablet by mouth daily. 11/06/15   Gardiner Barefootobert W Jakell Trusty, MD  hydrocortisone (ANUSOL-HC) 25 MG suppository Place 1 suppository (25 mg total) rectally 2 (two) times daily. 11/06/15   Gardiner Barefootobert W Akeya Ryther, MD  metroNIDAZOLE (FLAGYL) 500 MG tablet Take 1 tablet (500 mg total) by mouth 3 (three) times daily. 11/06/15   Gardiner Barefootobert W Gawain Crombie, MD  sulfamethoxazole-trimethoprim (BACTRIM DS,SEPTRA DS) 800-160 MG tablet Take 1 tablet by mouth daily. 11/06/15   Gardiner Barefootobert W Deondrick Searls, MD    Review of Systems Constitutional: positive for anorexia due to oral pain, negative for weight loss Respiratory: negative for cough or dyspnea on exertion Gastrointestinal: positive for diarrhea, abdominal pain described as  crampy and tenesmus, negative for nausea and vomiting Musculoskeletal: negative for myalgias and arthralgias Behavioral/Psych: positive for sleep difficulty, negative for anxiety All other systems reviewed and are negative   Physical Exam: CONSTITUTIONAL:in no apparent distress and alert  Vitals:   11/28/15 1535  BP: 106/73  Pulse: 87  Temp: 98.1 F (36.7 C)   Eyes: anicteric HENT: no thrush, no cervical lymphadenopathy; one oral ulcer noted on back left cheek Respiratory: Normal respiratory effort; CTA B GI: soft, nt, nd Cardiovascular: RRR   Lab Results  Component Value Date   HIV1RNAQUANT 52,813 (H) 09/07/2015   HIV1RNAQUANT 19,011 (H) 05/17/2015   HIV1RNAQUANT 47,485 (H) 04/03/2014   No components found for: HIV1GENOTYPRPLUS No components found for: THELPERCELL  Social History   Social History  . Marital status: Single    Spouse name: N/A  . Number of children: N/A  . Years of education: N/A   Occupational History  . Not on file.   Social History Main Topics  . Smoking status: Former Smoker    Types: Cigarettes  . Smokeless tobacco: Never Used  . Alcohol use Yes     Comment: occasionally  . Drug use:     Types: Methamphetamines     Comment: last use 09/2014  . Sexual activity: Yes   Other Topics Concern  . Not on file   Social History Narrative  . No narrative on file

## 2015-11-29 NOTE — Assessment & Plan Note (Signed)
Will check his labs today and hopefully improving.  [facility] but he will call if he is having continued issues and can consider changing to Tivicay/descovy rtc 2 months

## 2015-11-30 LAB — CYTOLOGY, (ORAL, ANAL, URETHRAL) ANCILLARY ONLY
Chlamydia: NEGATIVE
NEISSERIA GONORRHEA: NEGATIVE

## 2015-12-03 LAB — CYTOLOGY, (ORAL, ANAL, URETHRAL) ANCILLARY ONLY
CHLAMYDIA, DNA PROBE: NEGATIVE
Neisseria Gonorrhea: NEGATIVE

## 2015-12-04 ENCOUNTER — Telehealth: Payer: Self-pay | Admitting: *Deleted

## 2015-12-04 ENCOUNTER — Other Ambulatory Visit: Payer: Self-pay | Admitting: Internal Medicine

## 2015-12-04 NOTE — Telephone Encounter (Signed)
-----   Message from Gardiner Barefootobert W Comer, MD sent at 12/03/2015  1:28 PM EDT ----- Please let him know that his recent HIV viral load shows very persistent virus and the Stribild is clearly not working and he should stop it. Please add on a genotype and integrase inhibitor genotype and have him follow up with the pharmacist in 2 weeks to consider a new regimen once we get that information back.   thanks

## 2015-12-04 NOTE — Telephone Encounter (Signed)
Left voice mail to call the clinic x 2. Jay Simon

## 2015-12-08 LAB — HIV-1 INTEGRASE GENOTYPE

## 2015-12-11 ENCOUNTER — Other Ambulatory Visit: Payer: Self-pay | Admitting: Internal Medicine

## 2015-12-11 DIAGNOSIS — Z21 Asymptomatic human immunodeficiency virus [HIV] infection status: Secondary | ICD-10-CM

## 2015-12-13 ENCOUNTER — Other Ambulatory Visit: Payer: Self-pay

## 2015-12-19 LAB — HIV-1 GENOTYPR PLUS

## 2016-03-11 ENCOUNTER — Other Ambulatory Visit: Payer: Self-pay

## 2016-03-25 ENCOUNTER — Ambulatory Visit: Payer: Self-pay | Admitting: Internal Medicine

## 2016-04-02 ENCOUNTER — Ambulatory Visit (INDEPENDENT_AMBULATORY_CARE_PROVIDER_SITE_OTHER): Payer: Self-pay | Admitting: Internal Medicine

## 2016-04-02 ENCOUNTER — Encounter: Payer: Self-pay | Admitting: Internal Medicine

## 2016-04-02 VITALS — BP 117/83 | HR 125 | Temp 98.2°F | Wt 147.0 lb

## 2016-04-02 DIAGNOSIS — H60399 Other infective otitis externa, unspecified ear: Secondary | ICD-10-CM | POA: Insufficient documentation

## 2016-04-02 DIAGNOSIS — B20 Human immunodeficiency virus [HIV] disease: Secondary | ICD-10-CM

## 2016-04-02 DIAGNOSIS — H60393 Other infective otitis externa, bilateral: Secondary | ICD-10-CM

## 2016-04-02 DIAGNOSIS — Z79899 Other long term (current) drug therapy: Secondary | ICD-10-CM | POA: Insufficient documentation

## 2016-04-02 DIAGNOSIS — Z113 Encounter for screening for infections with a predominantly sexual mode of transmission: Secondary | ICD-10-CM

## 2016-04-02 DIAGNOSIS — B182 Chronic viral hepatitis C: Secondary | ICD-10-CM

## 2016-04-02 LAB — CBC WITH DIFFERENTIAL/PLATELET
BASOS ABS: 0 {cells}/uL (ref 0–200)
BASOS PCT: 0 %
EOS ABS: 288 {cells}/uL (ref 15–500)
Eosinophils Relative: 6 %
HEMATOCRIT: 41.9 % (ref 38.5–50.0)
Hemoglobin: 14 g/dL (ref 13.2–17.1)
Lymphocytes Relative: 38 %
Lymphs Abs: 1824 cells/uL (ref 850–3900)
MCH: 31 pg (ref 27.0–33.0)
MCHC: 33.4 g/dL (ref 32.0–36.0)
MCV: 92.7 fL (ref 80.0–100.0)
MONO ABS: 480 {cells}/uL (ref 200–950)
MONOS PCT: 10 %
MPV: 11.7 fL (ref 7.5–12.5)
Neutro Abs: 2208 cells/uL (ref 1500–7800)
Neutrophils Relative %: 46 %
Platelets: 181 10*3/uL (ref 140–400)
RBC: 4.52 MIL/uL (ref 4.20–5.80)
RDW: 13.3 % (ref 11.0–15.0)
WBC: 4.8 10*3/uL (ref 3.8–10.8)

## 2016-04-02 MED ORDER — CIPROFLOXACIN-DEXAMETHASONE 0.3-0.1 % OT SUSP: 4.0000 [drp] | Freq: Two times a day (BID) | OTIC | 0 refills | Status: DC

## 2016-04-02 NOTE — Assessment & Plan Note (Signed)
Will check labs today including genotype.  I will have him return in 6 weeks.

## 2016-04-02 NOTE — Addendum Note (Signed)
Addended by: Gardiner BarefootOMER, ROBERT W on: 04/02/2016 03:48 PM   Modules accepted: Orders

## 2016-04-02 NOTE — Assessment & Plan Note (Signed)
Will check lipids. 

## 2016-04-02 NOTE — Assessment & Plan Note (Signed)
I will try debrox for ear wax and ciprodex for infection treatment.

## 2016-04-02 NOTE — Progress Notes (Signed)
CC: Follow up for HIV  Interval history: He is here for follow up.  I last saw him in September and he was off of medications.  He was to restart Genvoya and did.  [facility] [place] with jail officials.  Tells me he has remained on Genvoya but did miss a period when he first got in jail. He is also having significant ear discomfort in both ears, difficulty hearing.  No fever, no chills.  No assoicated n/v/d. Having some issues with sleep even on Remeron.    Prior to Admission medications   Medication Sig Start Date End Date Taking? Authorizing Provider  elvitegravir-cobicistat-emtricitabine-tenofovir (GENVOYA) 150-150-200-10 MG TABS tablet Take 1 tablet by mouth daily. 11/06/15  Yes Gardiner Barefootobert W Kazue Cerro, MD  mirtazapine (REMERON) 30 MG tablet Take 30 mg by mouth at bedtime.   Yes Historical Provider, MD  sulfamethoxazole-trimethoprim (BACTRIM DS,SEPTRA DS) 800-160 MG tablet Take 1 tablet by mouth daily. 11/06/15  Yes Gardiner Barefootobert W Herta Hink, MD  ciprofloxacin-dexamethasone [facility](CIPRODEX) otic suspension Place 4 drops into both ears 2 (two) times daily. 04/02/16   Gardiner Barefootobert W Durk Carmen, MD    Review of Systems Constitutional: negative for fevers, chills, fatigue, malaise and anorexia Ears, nose, mouth, throat, and face: positive for hearing loss and earaches Gastrointestinal: negative for diarrhea Musculoskeletal: negative for myalgias and arthralgias All other systems reviewed and are negative    Physical Exam: CONSTITUTIONAL:in no apparent distress and alert  Vitals:   04/02/16 1446  BP: 117/83  Pulse: (!) 125  Temp: 98.2 F (36.8 C)   Eyes: anicteric HENT: no thrush, no cervical lymphadenopathy; bilateral ears with wax, some erythema in canal noted in right ear, both ears with tenderness with pulling down Respiratory: Normal respiratory effort; CTA B Cardiovascular: RRR  Lab Results  Component Value Date   HIV1RNAQUANT 68,468 (H) 11/28/2015   HIV1RNAQUANT 52,813 (H) 09/07/2015   HIV1RNAQUANT 19,011 (H) 05/17/2015   No components found for: HIV1GENOTYPRPLUS No components found for: THELPERCELL   SH: currently in jail

## 2016-04-02 NOTE — Assessment & Plan Note (Signed)
He will need treatment at some point once he is stable on HIV medications and remains off of drugs.

## 2016-04-03 LAB — COMPLETE METABOLIC PANEL WITH GFR
ALT: 20 U/L (ref 9–46)
AST: 27 U/L (ref 10–40)
Albumin: 4 g/dL (ref 3.6–5.1)
Alkaline Phosphatase: 71 U/L (ref 40–115)
BILIRUBIN TOTAL: 0.3 mg/dL (ref 0.2–1.2)
BUN: 11 mg/dL (ref 7–25)
CHLORIDE: 100 mmol/L (ref 98–110)
CO2: 28 mmol/L (ref 20–31)
Calcium: 9.3 mg/dL (ref 8.6–10.3)
Creat: 0.8 mg/dL (ref 0.60–1.35)
GFR, Est Non African American: >89 mL/min (ref >=60)
GLUCOSE: 110 mg/dL — AB (ref 65–99)
Potassium: 3.6 mmol/L (ref 3.5–5.3)
SODIUM: 136 mmol/L (ref 135–146)
TOTAL PROTEIN: 8.8 g/dL — AB (ref 6.1–8.1)

## 2016-04-03 LAB — RPR: RPR: REACTIVE — AB

## 2016-04-03 LAB — LIPID PANEL
Cholesterol: 122 mg/dL (ref <200)
HDL: 36 mg/dL — ABNORMAL LOW (ref >40)
LDL Cholesterol: 69 mg/dL (ref <100)
Total CHOL/HDL Ratio: 3.4 Ratio (ref <5.0)
Triglycerides: 85 mg/dL (ref <150)
VLDL: 17 mg/dL (ref <30)

## 2016-04-03 LAB — RPR TITER

## 2016-04-04 LAB — T-HELPER CELL (CD4) - (RCID CLINIC ONLY)
CD4 T CELL HELPER: 13 % — AB (ref 33–55)
CD4 T Cell Abs: 250 /uL — ABNORMAL LOW (ref 400–2700)

## 2016-04-04 LAB — FLUORESCENT TREPONEMAL AB(FTA)-IGG-BLD: FLUORESCENT TREPONEMAL ABS: REACTIVE — AB

## 2016-04-07 LAB — HIV-1 RNA QUANT-NO REFLEX-BLD
HIV 1 RNA Quant: 707 copies/mL — ABNORMAL HIGH (ref <20)
HIV-1 RNA Quant, Log: 2.85 Log copies/mL — ABNORMAL HIGH (ref <1.30)

## 2016-06-10 ENCOUNTER — Ambulatory Visit: Payer: Self-pay | Admitting: Internal Medicine

## 2016-07-30 ENCOUNTER — Ambulatory Visit: Payer: Self-pay | Admitting: Internal Medicine

## 2016-08-05 ENCOUNTER — Encounter: Payer: Self-pay | Admitting: Internal Medicine

## 2016-08-07 ENCOUNTER — Encounter: Payer: Self-pay | Admitting: Internal Medicine

## 2016-08-07 ENCOUNTER — Ambulatory Visit (INDEPENDENT_AMBULATORY_CARE_PROVIDER_SITE_OTHER): Payer: Self-pay | Admitting: Internal Medicine

## 2016-08-07 VITALS — BP 110/75 | Temp 98.5°F | Ht 69.0 in | Wt 180.0 lb

## 2016-08-07 DIAGNOSIS — Z7189 Other specified counseling: Secondary | ICD-10-CM

## 2016-08-07 DIAGNOSIS — Z23 Encounter for immunization: Secondary | ICD-10-CM

## 2016-08-07 DIAGNOSIS — B182 Chronic viral hepatitis C: Secondary | ICD-10-CM

## 2016-08-07 DIAGNOSIS — F5101 Primary insomnia: Secondary | ICD-10-CM

## 2016-08-07 DIAGNOSIS — G4700 Insomnia, unspecified: Secondary | ICD-10-CM | POA: Insufficient documentation

## 2016-08-07 DIAGNOSIS — Z7185 Encounter for immunization safety counseling: Secondary | ICD-10-CM | POA: Insufficient documentation

## 2016-08-07 DIAGNOSIS — B20 Human immunodeficiency virus [HIV] disease: Secondary | ICD-10-CM

## 2016-08-07 NOTE — Progress Notes (Signed)
   Subjective:    Patient ID: Jay SandhoffChristian Simon, male    DOB: 07/05/1990, 26 y.o.   MRN: [id number]  HPI Here for follow up of HIV> He continues on Genvoya while in jail and denies any missed doses.  No associated n/v/d.  No rashes.  No weight loss.   No issues taking the medication.  Sleeping well on Remeron.     Review of Systems  Constitutional: Negative for fatigue.  Gastrointestinal: Negative for diarrhea.  Skin: Negative for rash.  Neurological: Negative for dizziness.       Objective:   Physical Exam  Constitutional: He appears well-developed and well-nourished. No distress.  Eyes: No scleral icterus.  Cardiovascular: Normal rate, regular rhythm and normal heart sounds.   No murmur heard. Pulmonary/Chest: Effort normal and breath sounds normal. No respiratory distress.  Lymphadenopathy:    He has no cervical adenopathy.  Skin: No rash noted.    SH: [place] in [place] jail      Assessment & Plan:

## 2016-08-07 NOTE — Assessment & Plan Note (Signed)
Doing well.  Labs today and he can rtc in 4 months.

## 2016-08-07 NOTE — Assessment & Plan Note (Signed)
Meningitis vaccine today.

## 2016-08-07 NOTE — Assessment & Plan Note (Signed)
Once he is out of jail, will get him evaluated with elastography and get him on treatment.

## 2016-08-07 NOTE — Assessment & Plan Note (Signed)
Doing well with sleep on remeron, will continue

## 2016-08-08 LAB — T-HELPER CELL (CD4) - (RCID CLINIC ONLY)
CD4 % Helper T Cell: 16 % — ABNORMAL LOW (ref 33–55)
CD4 T CELL ABS: 360 /uL — AB (ref 400–2700)

## 2016-08-09 LAB — HIV-1 RNA QUANT-NO REFLEX-BLD
HIV 1 RNA QUANT: DETECTED {copies}/mL — AB
HIV-1 RNA Quant, Log: <1.3 Log copies/mL — AB

## 2017-01-12 IMAGING — US US ART/VEN ABD/PELV/SCROTUM DOPPLER LTD
1 series · 13 of 25 positions shown · non-contrast
Comparison: None.

CLINICAL DATA: Left-sided scrotal pain with vomiting Find

EXAM:
SCROTAL ULTRASOUND
DOPPLER ULTRASOUND OF THE TESTICLES
TECHNIQUE: Complete ultrasound examination of the testicles, epididymis, and
other scrotal structures was performed. Color and spectral Doppler
ultrasound were also utilized to evaluate blood flow to the
testicles.

[Series 1: us art/ven abd/pelv/scrotum doppler ltd · 0.07mm/px · 13 of 53 slices shown]
[im 1/53]
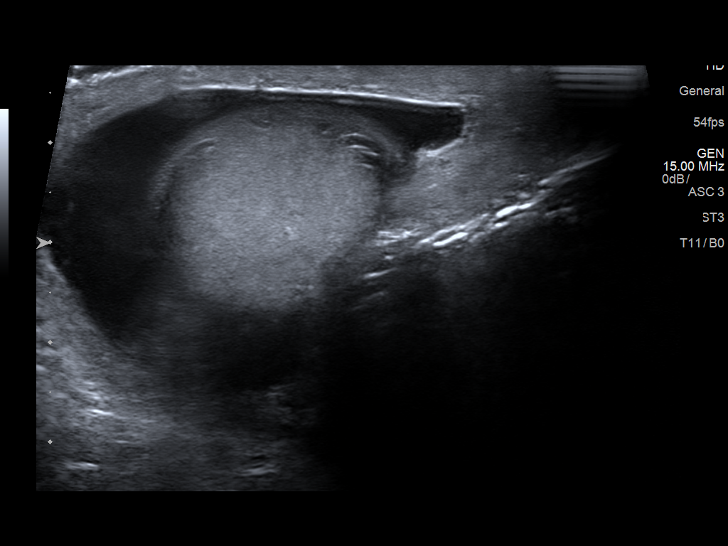
[im 5/53]
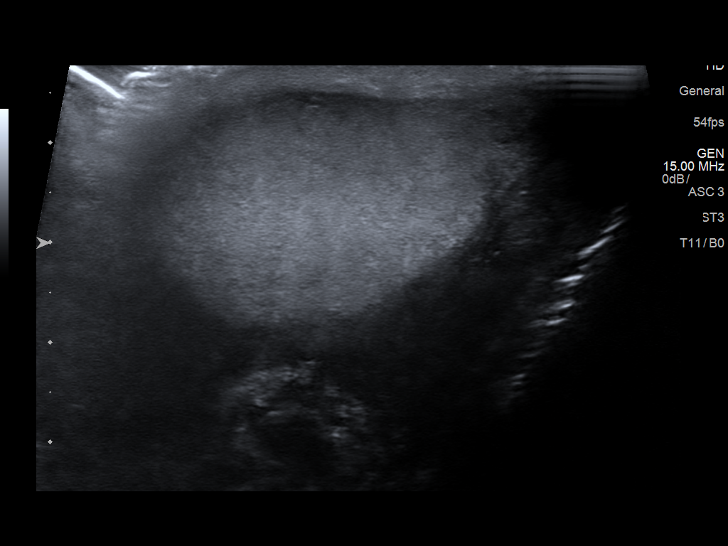
[im 9/53]
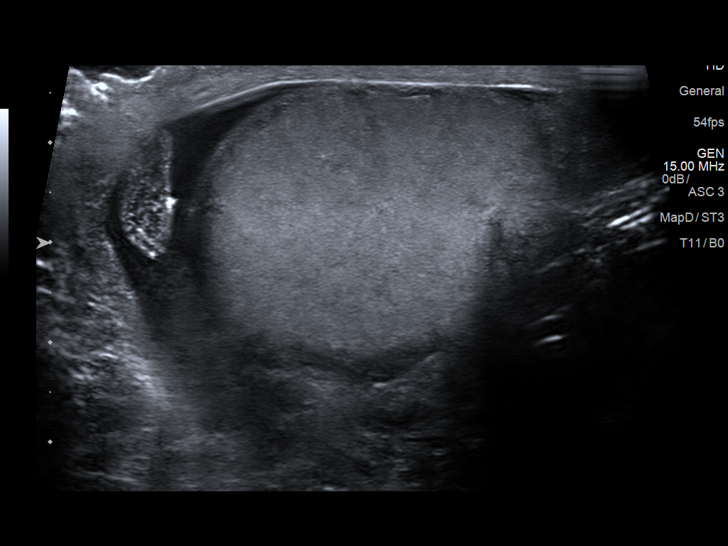
[im 14/53]
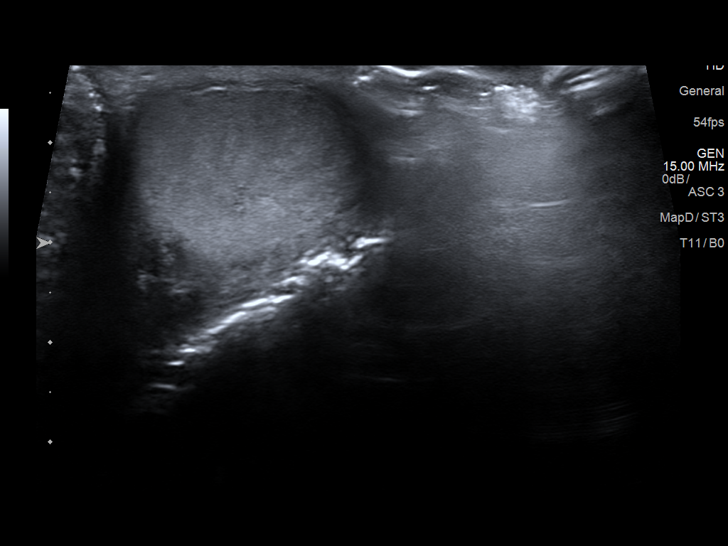
[im 18/53]
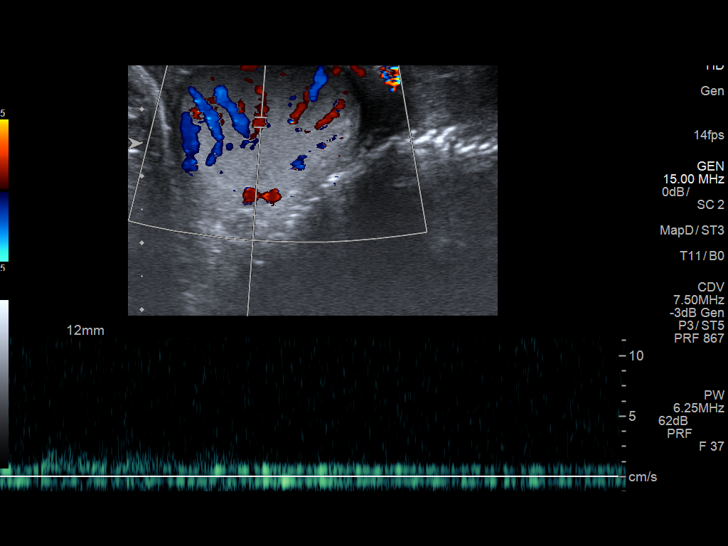
[im 22/53]
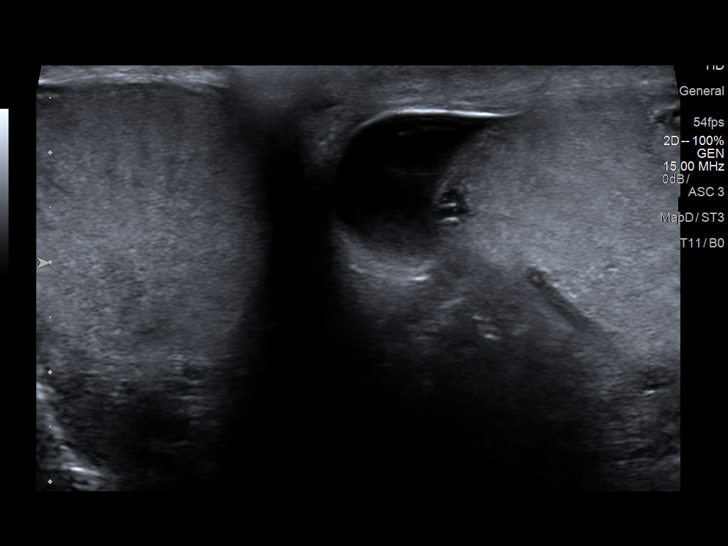
[im 27/53]
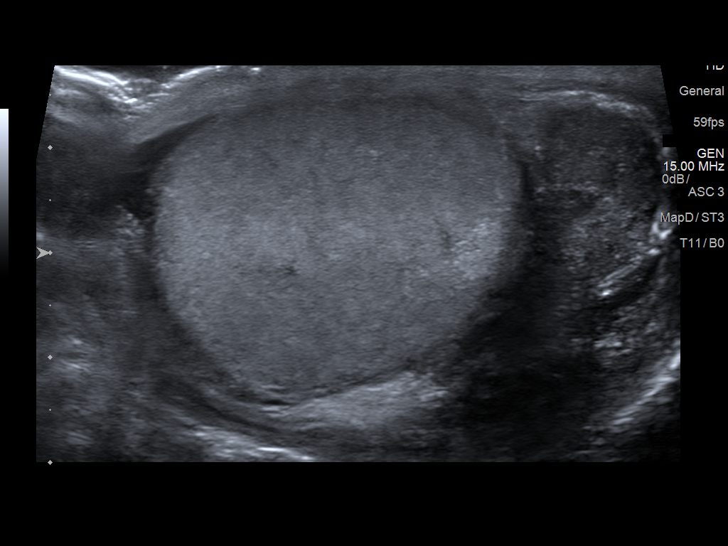
[im 31/53]
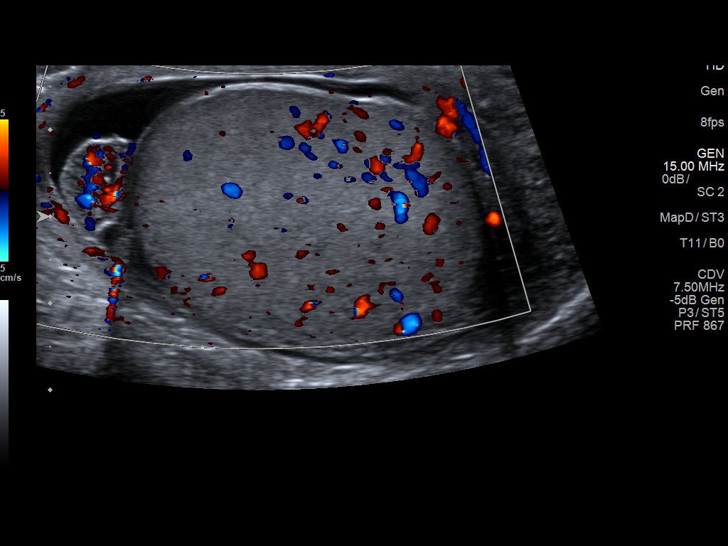
[im 35/53]
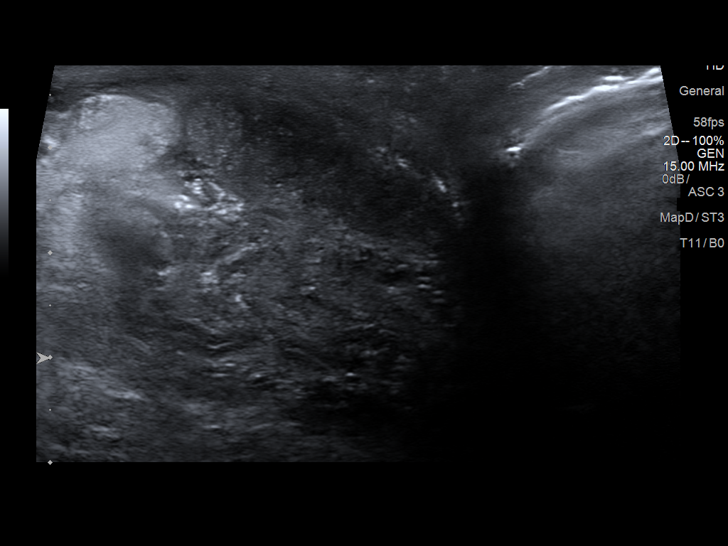
[im 40/53]
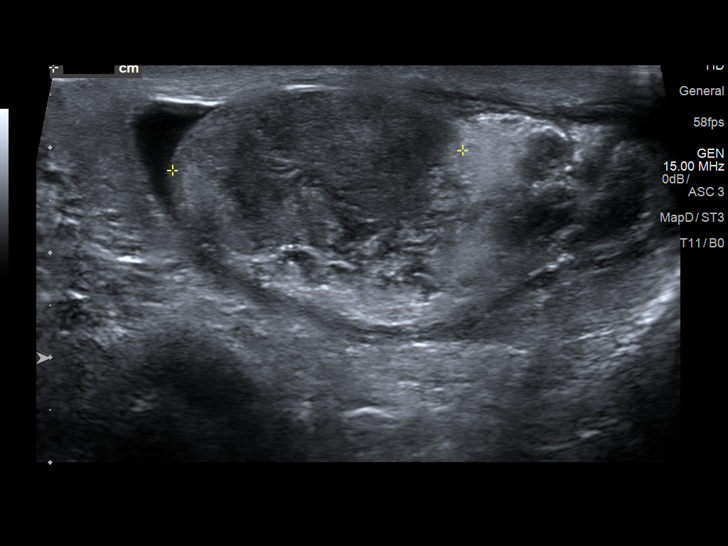
[im 44/53]
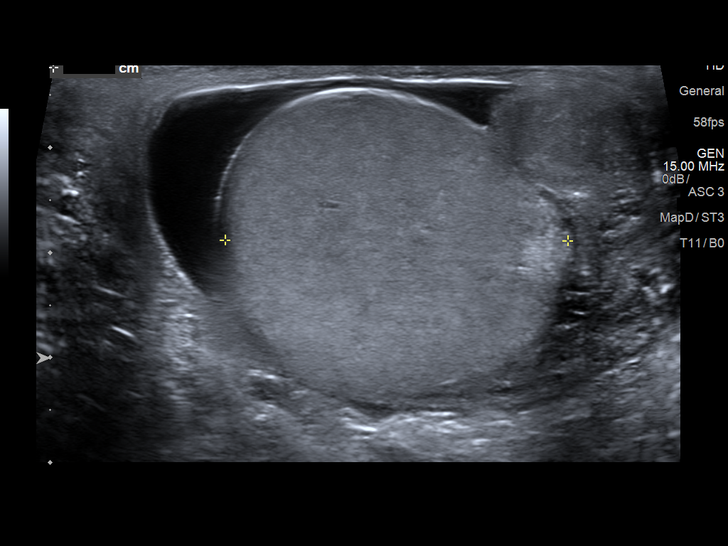
[im 48/53]
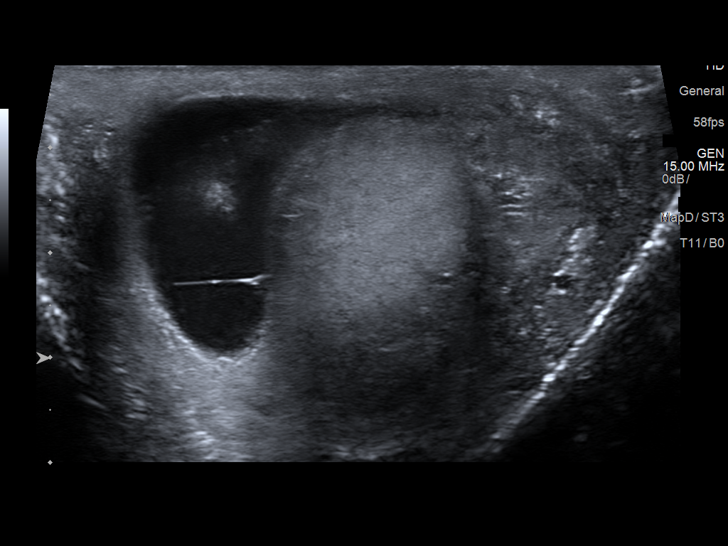
[im 53/53]
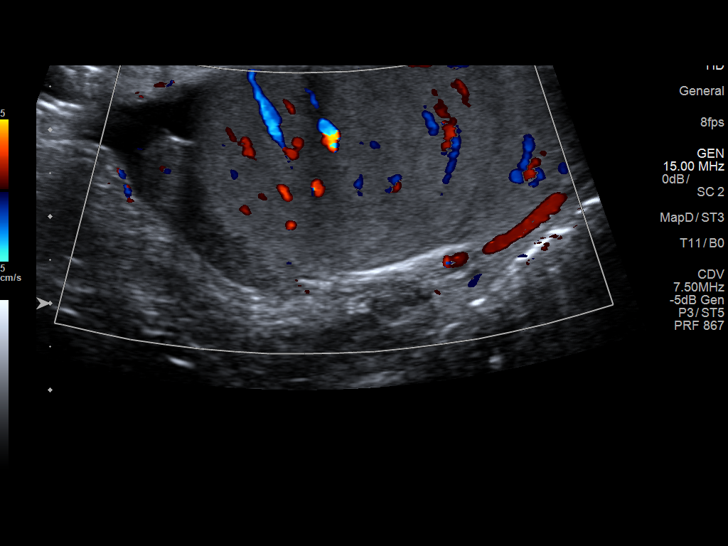

[13 of 25 positions shown; findings below may reference images not displayed]

FINDINGS: Right testicle

Measurements: 4.8 x 2.6 x 2.8 cm. No mass or microlithiasis
visualized.

Left testicle

Measurements: 4.4 x 2.8 x 3.3 cm. No mass or microlithiasis
visualized. Left testis is hyperemic and subtly edematous.

Right epididymis: The right epididymis is normal in size and contour
without inflammation. A 2 mm calcification is noted in the head of
the epididymis incidentally.

Left epididymis: The left epididymis is markedly edematous with
extensive hyperemia. There is no well-defined epididymal mass on the
left.

Hydrocele: There are bilateral hydroceles, moderate on the left and
slightly smaller on the right. A septation is noted in the hydrocele
on the left.

Varicocele:  None visualized.

Pulsed Doppler interrogation of both testes demonstrates normal low
resistance arterial and venous waveforms bilaterally.
IMPRESSION: Marked enlargement of the left epididymis with extensive hyperemia
of the left epididymis. There is also hyperemia in the left testis
with slight left testicular edema. These findings are consistent
with epididymo-orchitis with inflammation primarily involving the
left epididymis.

There are bilateral hydroceles, larger on the left than on the
right. Mild septation in the left sided hydrocele is likely due to
inflammatory change.

Small calcification in the right epididymis may represent residua of
previous inflammatory lesion.

No demonstrable testicular mass or torsion.

## 2017-04-01 ENCOUNTER — Ambulatory Visit: Payer: Self-pay | Admitting: Internal Medicine

## 2017-04-01 ENCOUNTER — Ambulatory Visit: Payer: Self-pay

## 2021-08-27 ENCOUNTER — Encounter: Payer: Self-pay | Admitting: *Deleted
